# Patient Record
Sex: Female | Born: 1970 | Race: Black or African American | Hispanic: No | Marital: Married | Smoking: Never smoker
Health system: Southern US, Community
[De-identification: ages and names within clinical notes are randomized; demographics above are authoritative.]

## PROBLEM LIST (undated history)

## (undated) DIAGNOSIS — J45909 Unspecified asthma, uncomplicated: Secondary | ICD-10-CM

## (undated) DIAGNOSIS — I10 Essential (primary) hypertension: Secondary | ICD-10-CM

## (undated) DIAGNOSIS — D509 Iron deficiency anemia, unspecified: Principal | ICD-10-CM

## (undated) DIAGNOSIS — N920 Excessive and frequent menstruation with regular cycle: Secondary | ICD-10-CM

## (undated) HISTORY — DX: Excessive and frequent menstruation with regular cycle: N92.0

## (undated) HISTORY — PX: BACK SURGERY: SHX140

## (undated) HISTORY — DX: Iron deficiency anemia, unspecified: D50.9

---

## 1997-08-17 ENCOUNTER — Encounter: Admission: RE | Admit: 1997-08-17 | Discharge: 1997-08-17 | Payer: Self-pay | Admitting: *Deleted

## 1998-03-27 ENCOUNTER — Other Ambulatory Visit: Admission: RE | Admit: 1998-03-27 | Discharge: 1998-03-27 | Payer: Self-pay | Admitting: Obstetrics

## 1999-06-02 ENCOUNTER — Other Ambulatory Visit: Admission: RE | Admit: 1999-06-02 | Discharge: 1999-06-02 | Payer: Self-pay | Admitting: Obstetrics

## 2001-07-17 ENCOUNTER — Inpatient Hospital Stay (HOSPITAL_COMMUNITY): Admission: AD | Admit: 2001-07-17 | Discharge: 2001-07-17 | Payer: Self-pay | Admitting: Obstetrics

## 2001-09-06 ENCOUNTER — Encounter (INDEPENDENT_AMBULATORY_CARE_PROVIDER_SITE_OTHER): Payer: Self-pay | Admitting: Specialist

## 2001-09-06 ENCOUNTER — Encounter: Payer: Self-pay | Admitting: Obstetrics

## 2001-09-06 ENCOUNTER — Inpatient Hospital Stay (HOSPITAL_COMMUNITY): Admission: AD | Admit: 2001-09-06 | Discharge: 2001-09-09 | Payer: Self-pay | Admitting: Obstetrics

## 2002-03-21 ENCOUNTER — Encounter: Payer: Self-pay | Admitting: Neurosurgery

## 2002-03-27 ENCOUNTER — Encounter: Payer: Self-pay | Admitting: Neurosurgery

## 2002-03-27 ENCOUNTER — Ambulatory Visit (HOSPITAL_COMMUNITY): Admission: RE | Admit: 2002-03-27 | Discharge: 2002-03-27 | Payer: Self-pay | Admitting: Neurosurgery

## 2006-03-16 ENCOUNTER — Inpatient Hospital Stay (HOSPITAL_COMMUNITY): Admission: RE | Admit: 2006-03-16 | Discharge: 2006-03-19 | Payer: Self-pay | Admitting: Obstetrics

## 2006-03-16 ENCOUNTER — Encounter (INDEPENDENT_AMBULATORY_CARE_PROVIDER_SITE_OTHER): Payer: Self-pay | Admitting: Specialist

## 2009-02-07 ENCOUNTER — Ambulatory Visit (HOSPITAL_COMMUNITY): Admission: RE | Admit: 2009-02-07 | Discharge: 2009-02-07 | Payer: Self-pay | Admitting: Obstetrics and Gynecology

## 2010-02-02 ENCOUNTER — Encounter: Payer: Self-pay | Admitting: Obstetrics

## 2010-02-07 ENCOUNTER — Encounter
Admission: RE | Admit: 2010-02-07 | Discharge: 2010-02-07 | Payer: Self-pay | Source: Home / Self Care | Attending: Family Medicine | Admitting: Family Medicine

## 2010-05-30 NOTE — Op Note (Signed)
NAMEDAYTONA, HEDMAN              ACCOUNT NO.:  [id number]   MEDICAL RECORD NO.:  [id number]          PATIENT TYPE:  INP   LOCATION:  9125                          FACILITY:  [facility]   PHYSICIAN:  Kathreen Cosier, M.D.DATE OF BIRTH:  [birth year]   DATE OF PROCEDURE:  03/16/2006  DATE OF DISCHARGE:                               OPERATIVE REPORT   PREOPERATIVE DIAGNOSIS:  Previous cesarean section at term, desires  repeat and tubal ligation.   SURGEON:  Kathreen Cosier, M.D.   FIRST ASSISTANT:  Charles A. Clearance Coots, M.D.   ANESTHESIA:  Spinal anesthesia.   DESCRIPTION OF PROCEDURE:  The patient was placed on the operating room  table in the supine position.  The abdomen was prepped and draped,  bladder emptied with Foley catheter.  A transverse suprapubic incision  was made through old scar, carried down through the rectus fascia.  The  fascia cleaned and incised to the left of the incision.  Rectus muscles  retracted laterally.  The peritoneum was incised longitudinally.  A  transverse incision was made in the viscera peritoneum of the bladder.  Bladder mobilized superiorly.  A transverse lower uterine incision was  made.  Fluid clear.  Patient delivered from the LOA position of a  female, Apgar 8 and 9 weighing 7 pounds 2 ounces.  Pediatrician in  attendance.  Placenta was found and removed manually and sent to labor  and delivery.  The uterine cavity cleaned with dry laps.  The uterine  incision closed in one layer with continuous suture of #1 chromic.  Hemostasis satisfactory.  Bladder flap re-attached with 2-0 chromic.  Uterus well contracted, tubes and ovaries normal.  The right tube was  grasped in the mid portion with a Babcock clamp, 0 plain suture placed  in the mesosalpinx, with noted portion of tube within the clamp.  This  was tied and approximately one inch of tube transected.  Hemostasis  satisfactory.  The procedure was done in a similar fashion on other  side.   Lap and sponge counts correct.  Abdomen closed in layers,  peritoneum continuous suture 0 chromic, fascia continuous suture of 0  Dexon and the skin closed with subcuticular stitch of 4-0 Monocryl.  Blood loss 500 mL.           ______________________________  Kathreen Cosier, M.D.     BAM/MEDQ  D:  03/16/2006  T:  03/16/2006  Job:  [id number]

## 2010-05-30 NOTE — H&P (Signed)
   NAME:  Caitlin Hernandez, Caitlin Hernandez                        ACCOUNT NO.:  [id number]   MEDICAL RECORD NO.:  [id number]                   PATIENT TYPE:  INP   LOCATION:  9145                                 FACILITY:  [facility]   PHYSICIAN:  Kathreen Cosier, M.D.           DATE OF BIRTH:  Sep 10, 1970   DATE OF ADMISSION:  09/06/2001  DATE OF DISCHARGE:                                HISTORY & PHYSICAL   HISTORY:  The patient is a 40 year old gravida 1, estimated date of  conception  09-03-01 who was to be admitted for induction on the morning of  09-06-01.  However, she came in early labor. Cervix 1 cm, central placenta,  vertex -2 to -1 station.  She had a positive group B streptococcus which was  treated at 36 weeks while hospitalized.  Her membranes were artificially  ruptured. The fluid was clear.  Throughout the course at the hospital in  labor the patient never received more than 3 milliunits of Pitocin.  By  12:45 p.m. the patient was 4 to 5 cm, 100%, vertex -2 with molding.  She was  contracting every two minutes.  The patient had a 4 minute deceleration. I  was called at 2:53 p.m. by the nurse and told that the patient had delivered  without me.  I left the office, came to the hospital where the patient was  being pushed down the hall to the operating room at 3:02 p.m. because of  prolonged fetal bradycardia.  She had an epidural in place and surgery was  begun at 3:05 p.m.   PHYSICAL EXAMINATION:  Physical examination revealed a well-developed female  in labor.  HEENT:  Negative.  LUNGS:  Clear.  HEART:  Regular rate. No murmurs. No gallops.  ABDOMEN: Term sized uterus. Estimated fetal weight 7 pounds 8 ounces. At the  time of physical on  admission her cervix was 1 cm dilated.  EXTREMITIES:  Legs were negative.                                               Kathreen Cosier, M.D.    BAM/MEDQ  D:  09/06/2001  T:  09/07/2001  Job:  [id number]

## 2010-05-30 NOTE — Op Note (Signed)
NAME:  Caitlin Hernandez, Caitlin Hernandez                        ACCOUNT NO.:  [id number]   MEDICAL RECORD NO.:  [id number]                   PATIENT TYPE:  OIB   LOCATION:  3172                                 FACILITY:  [facility]   PHYSICIAN:  Donalee Citrin, M.D.                     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   PROCEDURE:  Lumbar laminectomy and microdiskectomy, L5-S1 right, with  microscopic dissection of the right S1 nerve root and microscopic  diskectomy.   SURGEON:  Donalee Citrin, M.D.   ASSISTANT:  Marland Mcalpine. Elesa Hacker, M.D.   ANESTHESIA:  General endotracheal.   CLINICAL NOTE:  The patient is a very pleasant 40 year old female who has  had long-standing back and right leg pain radiating down to the outside of  the foot and the sole of the foot with numbness and tingling in the same  distribution.  The patient has been refractory to conservative treatment.  Preoperative imaging showed a very large ruptured disk at L5-S1 compressing  the right S1 nerve root.  I extensively recommended decompression, lumbar  laminectomy and microdiskectomy.  I went over the risks and benefits of  surgery with her.  She understands and agreed to proceed forward.   DESCRIPTION OF PROCEDURE:  The patient was brought to the OR, was induced  under general anesthesia, positioned prone on the Wilson frame.  The back  was prepped and draped in the usual sterile fashion.  After infiltration of  10 mL of lidocaine with epinephrine, a midline incision was made.  Bovie  electrocautery was used to take down through the subcutaneous tissue and  subperiosteal dissection was carried out in the lamina of L5 and S1 on the  right side.  Then using a 3 and 4 mm Kerrison punch, the inferior aspect of  the lamina of L5 and the medial aspect of the  facet complex as well as the  superior aspect of the lamina of S1.  Then ligamentum flavum was removed.  The ligamentum flavum was removed in piecemeal fashion after localization  film confirmed the L5-S1 disk space.  Then the thecal sac was then  visualized.  The operating microscope was draped, brought into the field.  Under microscopic illumination the remainder of the S1 nerve root was  identified.  It was also noted to be overlying an S2 nerve root.  The S1  nerve root was explored with a 4 Penfield and noted to be very adherent to a  large, bulbous disk fragment inferior to it.  Then the epidural veins were  coagulated.  Using a 4 Penfield, the S1 nerve root was dissected free of the  large, bulbous disk fragment and reflected medially with  a D'Errico nerve  root retractor.  Then using an 11 blade scalpel, an annulotomy was made.  Several large fragments of disk were removed from the disk space.  Then the  disk space was radically cleaned out with pituitary rongeurs and downgoing  Epstein curettes.  At the end of the diskectomy the S1 nerve root was freely  mobile over the entirety of the disk space.  The axilla was explored as well  as the thecal sac cephalad and caudal.  There was no further stenosis or  ruptured disk appreciated.  The neural foramen was explored with a coronary  dilator and an angled hockey stick and noted to have no further stenosis.  Then the wound was copiously irrigated and meticulous hemostasis was  maintained.  Gelfoam was overlaid on top of the dura.  Muscle and fascia  were reapproximated with 0 interrupted Vicryl, the subcutaneous  tissue was closed with 2-0 interrupted Vicryl, and the skin was closed with  running 4-0 subcuticular.  Benzoin and Steri-Strips were applied.  The  patient went to the recovery room in stable condition.  At the end of the  case all needle counts and sponge counts were correct.                                                Donalee Citrin, M.D.    GC/MEDQ  D:  03/27/2002  T:  03/27/2002  Job:  [id number]

## 2010-05-30 NOTE — Discharge Summary (Signed)
NAMEWYONIA, FONTANELLA              ACCOUNT NO.:  [id number]   MEDICAL RECORD NO.:  [id number]          PATIENT TYPE:  INP   LOCATION:  9125                          FACILITY:  [facility]   PHYSICIAN:  Kathreen Cosier, M.D.DATE OF BIRTH:  07-03-1970   DATE OF ADMISSION:  03/16/2006  DATE OF DISCHARGE:  03/19/2006                               DISCHARGE SUMMARY   The patient is a 40 year old gravida 2, para 1-0-0-1, EDC March 22, 2006.  She had a previous C-section, desired repeat C-section and tubal  ligation at term.  She underwent repeat low transverse cesarean section,  had a female Apgar 8 and 9, weighing 7 pounds and 2 ounces, and a tubal  ligation.  On admission, her hemoglobin was 11, postoperative 8.9.  White count 5 to 6.6.  PT/PTT normal.  Sodium 133, potassium 3.4,  chloride 105, urine negative.  She was discharged on the third  postoperative day ambulatory on a regular diet on Tylenol #3 for pain  and ferrous sulfate for her anemia.   DISCHARGE DIAGNOSIS:  Status post repeat low transverse cesarean section  and tubal ligation at term.           ______________________________  Kathreen Cosier, M.D.     BAM/MEDQ  D:  04/14/2006  T:  04/14/2006  Job:  [id number]

## 2010-05-30 NOTE — Op Note (Signed)
Caitlin Hernandez, Caitlin Hernandez              ACCOUNT NO.:  [id number]   MEDICAL RECORD NO.:  [id number]          PATIENT TYPE:  INP   LOCATION:  9125                          [facility]   PHYSICIAN:  Kathreen Cosier, M.D.DATE OF BIRTH:  17-Apr-1970   DATE OF PROCEDURE:  03/16/2006  DATE OF DISCHARGE:                               OPERATIVE REPORT   PREOPERATIVE DIAGNOSIS:  DICTATION ENDED AT THIS POINT.           ______________________________  Kathreen Cosier, M.D.     BAM/MEDQ  D:  03/16/2006  T:  03/16/2006  Job:  [id number]

## 2010-05-30 NOTE — Discharge Summary (Signed)
   NAME:  Caitlin Hernandez, Caitlin Hernandez                        ACCOUNT NO.:  [id number]   MEDICAL RECORD NO.:  [id number]                   PATIENT TYPE:  INP   LOCATION:  9145                                 [facility]   PHYSICIAN:  Kathreen Cosier, M.D.           DATE OF BIRTH:  [birth year]   DATE OF ADMISSION:  09/06/2001  DATE OF DISCHARGE:  09/09/2001                                 DISCHARGE SUMMARY   HISTORY OF PRESENT ILLNESS:  The patient is a 40 year old primigravida, [facility] [place]  September 03, 2001 who was brought in on August 26 for induction but she was  already in labor.  Cervix 1 cm, 70%, vertex, -1/-2.  She had a positive GBS  which was treated at 35 weeks.  Amniotomy was performed and the fluid was  clear.  The patient was in labor and received a maximum of 3 milliunits of  Pitocin throughout her labor.  She dilated to 8 cm and suddenly had fetal  bradycardia lasting four minutes which recovered and then another episode  which did not.  She was taken for an emergency cesarean section and had a  female Apgars 3 and 8 from the LOA position.  Cord pH 6.96.  Baby weighed 7  pounds.  Placenta was sent to pathology.  Postoperatively the patient did  well.  She was discharged on the third postoperative day and returned to  regular diet.  On Tylox for pain.  Hemoglobin 12.2.   DISCHARGE DIAGNOSES:  Status post primary low transverse cesarean section at  term for nonreassuring fetal heart rate tracing.                                               Kathreen Cosier, M.D.    BAM/MEDQ  D:  09/09/2001  T:  09/09/2001  Job:  [id number]

## 2010-05-30 NOTE — Op Note (Signed)
   NAME:  Caitlin Hernandez, Caitlin Hernandez                        ACCOUNT NO.:  [id number]   MEDICAL RECORD NO.:  [id number]                   PATIENT TYPE:  INP   LOCATION:  9145                                 [facility]   PHYSICIAN:  Kathreen Cosier, M.D.           DATE OF BIRTH:  04-10-1970   DATE OF PROCEDURE:  09/06/2001  DATE OF DISCHARGE:                                 OPERATIVE REPORT   PREOPERATIVE DIAGNOSIS:  Prolonged fetal bradycardia.   SURGEON:  Kathreen Cosier, M.D.   FIRST ASSISTANT:  Naima A. Normand Sloop, M.D.   PROCEDURE IN DETAIL:  The patient placed on the operating table in the  supine position.  The abdomen prepped and draped.  The bladder emptied with  a Foley catheter.  Transverse suprapubic incision made at 3:05 p.m., carried  down to the rectus fascia.  The fascia cleaned and incised the length of the  incision.  Rectus muscle retracted lateral.  The peritoneum incised  longitudinally.  The subperitoneum above the bladder was incised and bladder  mobilized inferiorly.  Transverse low uterine incision made.  The fluid was  clear.  The patient delivered at 3:07 p.m. of a female, Apgars 3/8.  Team  was in attendance.  The placenta was anterior and removed manually and sent  to pathology.  The uterus cleaned with dry laps.  The cord pH was 6.96.  The  uterine cavity was cleaned with dry laps, the uterine incision closed in one  layer with continuous suture of 1 chromic including myometrium and  endometrium.  The myometrium was imbricated in 1 chromic suture.  The  bladder flap reattached with 2-0 chromic.  The uterus was well contracted.  Tubes were normal.  Ovaries normal.  The abdomen closed in layers.  The  peritoneum continuous suture of 0 chromic fashion, continuous suture of 0  Dexon.  The skin closed with subcuticular suture of 3-0 plain.  Blood loss  500 cc.  The patient tolerated the procedure well, taken to the recovery  room in good condition.                                                Kathreen Cosier, M.D.    BAM/MEDQ  D:  09/06/2001  T:  09/06/2001  Job:  [id number]

## 2012-05-25 ENCOUNTER — Emergency Department (HOSPITAL_COMMUNITY)
Admission: EM | Admit: 2012-05-25 | Discharge: 2012-05-25 | Disposition: A | Discharge status: Home / Self Care | Payer: BC Managed Care – PPO | Attending: Emergency Medicine | Admitting: Emergency Medicine

## 2012-05-25 ENCOUNTER — Encounter (HOSPITAL_COMMUNITY): Payer: Self-pay | Admitting: Emergency Medicine

## 2012-05-25 ENCOUNTER — Emergency Department (HOSPITAL_COMMUNITY): Payer: BC Managed Care – PPO

## 2012-05-25 DIAGNOSIS — I10 Essential (primary) hypertension: Secondary | ICD-10-CM | POA: Insufficient documentation

## 2012-05-25 DIAGNOSIS — J45901 Unspecified asthma with (acute) exacerbation: Secondary | ICD-10-CM | POA: Insufficient documentation

## 2012-05-25 DIAGNOSIS — Z79899 Other long term (current) drug therapy: Secondary | ICD-10-CM | POA: Insufficient documentation

## 2012-05-25 DIAGNOSIS — J40 Bronchitis, not specified as acute or chronic: Secondary | ICD-10-CM

## 2012-05-25 DIAGNOSIS — R0789 Other chest pain: Secondary | ICD-10-CM | POA: Insufficient documentation

## 2012-05-25 HISTORY — DX: Unspecified asthma, uncomplicated: J45.909

## 2012-05-25 HISTORY — DX: Essential (primary) hypertension: I10

## 2012-05-25 LAB — CBC WITH DIFFERENTIAL/PLATELET
Eosinophils Relative: 16 % — ABNORMAL HIGH (ref 0–5)
HCT: 33.2 % — ABNORMAL LOW (ref 36.0–46.0)
Hemoglobin: 11.3 g/dL — ABNORMAL LOW (ref 12.0–15.0)
Lymphocytes Relative: 41 % (ref 12–46)
Lymphs Abs: 2.2 10*3/uL (ref 0.7–4.0)
MCV: 84.5 fL (ref 78.0–100.0)
Monocytes Absolute: 0.3 10*3/uL (ref 0.1–1.0)
Monocytes Relative: 6 % (ref 3–12)
RBC: 3.93 MIL/uL (ref 3.87–5.11)
WBC: 5.3 10*3/uL (ref 4.0–10.5)

## 2012-05-25 LAB — COMPREHENSIVE METABOLIC PANEL
ALT: 16 U/L (ref 0–35)
CO2: 26 mEq/L (ref 19–32)
Calcium: 9.1 mg/dL (ref 8.4–10.5)
Creatinine, Ser: 0.63 mg/dL (ref 0.50–1.10)
GFR calc Af Amer: >90 mL/min (ref >90)
GFR calc non Af Amer: >90 mL/min (ref >90)
Glucose, Bld: 116 mg/dL — ABNORMAL HIGH (ref 70–99)
Sodium: 139 mEq/L (ref 135–145)

## 2012-05-25 MED ORDER — PREDNISONE 50 MG PO TABS: ORAL_TABLET | ORAL | Status: DC

## 2012-05-25 MED ORDER — ALBUTEROL SULFATE (5 MG/ML) 0.5% IN NEBU
INHALATION_SOLUTION | RESPIRATORY_TRACT | Status: AC
  Filled 2012-05-25: qty 1

## 2012-05-25 MED ORDER — ALBUTEROL SULFATE (5 MG/ML) 0.5% IN NEBU
5.0000 mg | INHALATION_SOLUTION | Freq: Once | RESPIRATORY_TRACT | Status: AC
  Administered 2012-05-25: 5 mg via RESPIRATORY_TRACT
  Filled 2012-05-25: qty 1

## 2012-05-25 MED ORDER — IPRATROPIUM BROMIDE 0.02 % IN SOLN
0.5000 mg | Freq: Once | RESPIRATORY_TRACT | Status: AC
  Administered 2012-05-25: 0.5 mg via RESPIRATORY_TRACT
  Filled 2012-05-25: qty 2.5

## 2012-05-25 MED ORDER — IPRATROPIUM BROMIDE 0.02 % IN SOLN
RESPIRATORY_TRACT | Status: AC
  Filled 2012-05-25: qty 2.5

## 2012-05-25 MED ORDER — DOXYCYCLINE HYCLATE 100 MG PO CAPS: 100.0000 mg | ORAL_CAPSULE | Freq: Two times a day (BID) | ORAL | Status: DC

## 2012-05-25 MED ORDER — ALBUTEROL SULFATE HFA 108 (90 BASE) MCG/ACT IN AERS: 2.0000 | INHALATION_SPRAY | RESPIRATORY_TRACT | Status: DC | PRN

## 2012-05-25 MED ORDER — PREDNISONE 20 MG PO TABS
60.0000 mg | ORAL_TABLET | Freq: Once | ORAL | Status: AC
  Administered 2012-05-25: 60 mg via ORAL
  Filled 2012-05-25: qty 3

## 2012-05-25 NOTE — ED Notes (Signed)
Pulse ox is 96-98% ra heat rate is  98-110

## 2012-05-25 NOTE — ED Provider Notes (Signed)
History     CSN: [id number]  Arrival date & time 05/25/12  0746   First MD Initiated Contact with Patient 05/25/12 0800      Chief Complaint  Patient presents with  . Cough    (Consider location/radiation/quality/duration/timing/severity/associated sxs/prior treatment) HPI Comments: Patient presents with one week history of difficulty breathing and feels like she is "breathing through a straw". It is associated with dry cough, worse in the morning and worse when she goes outside. Has some chest tightness with coughing. Denies any fevers or chills. Today she saw a speck of blood when she was coughing. Denies any leg pain or swelling. Denies any recent travel. Denies being diagnosed with asthma but has used inhalers in the past for seasonal breathing difficulties. Denies any smoking history.  The history is provided by the patient.    Past Medical History  Diagnosis Date  . Asthma   . Hypertension     No past surgical history on file.  No family history on file.  History  Substance Use Topics  . Smoking status: Never Smoker   . Smokeless tobacco: Not on file  . Alcohol Use: Yes    OB History   Grav Para Term Preterm Abortions TAB SAB Ect Mult Living                  Review of Systems  Constitutional: Positive for activity change and appetite change. Negative for fever.  HENT: Negative for congestion and rhinorrhea.   Respiratory: Positive for cough, chest tightness and wheezing.   Cardiovascular: Negative for chest pain.  Gastrointestinal: Negative for nausea, vomiting and abdominal pain.  Genitourinary: Negative for dysuria, hematuria, vaginal bleeding and vaginal discharge.  Musculoskeletal: Negative for back pain.  Skin: Negative for wound.  Neurological: Negative for dizziness, weakness and headaches.  A complete 10 system review of systems was obtained and all systems are negative except as noted in the HPI and PMH.    Allergies  Review of patient's  allergies indicates no known allergies.  Home Medications   Current Outpatient Rx  Name  Route  Sig  Dispense  Refill  . GuaiFENesin (MUCINEX PO)   Oral   Take 1 tablet by mouth 2 (two) times daily as needed (for congestion).          Marland Kitchen loratadine (CLARITIN) 10 MG tablet   Oral   Take 10 mg by mouth daily.         . Olmesartan-Amlodipine-HCTZ (TRIBENZOR) 20-5-12.5 MG TABS   Oral   Take 1 tablet by mouth daily.         Marland Kitchen albuterol (PROVENTIL HFA;VENTOLIN HFA) 108 (90 BASE) MCG/ACT inhaler   Inhalation   Inhale 2 puffs into the lungs every 4 (four) hours as needed for wheezing.   1 Inhaler   0   . doxycycline (VIBRAMYCIN) 100 MG capsule   Oral   Take 1 capsule (100 mg total) by mouth 2 (two) times daily.   20 capsule   0   . predniSONE (DELTASONE) 50 MG tablet      1 tablet PO daily   5 tablet   0     BP 120/79  Pulse 77  Temp(Src) 98.8 F (37.1 C) (Oral)  Resp 18  SpO2 97%  LMP 05/12/2012  Physical Exam  Constitutional: She is oriented to person, place, and time. She appears well-developed and well-nourished. No distress.  HENT:  Head: Normocephalic and atraumatic.  Mouth/Throat: Oropharynx is clear and moist. No  oropharyngeal exudate.  Eyes: Conjunctivae and EOM are normal. Pupils are equal, round, and reactive to light.  Neck: Normal range of motion. Neck supple.  Cardiovascular: Normal rate, regular rhythm and normal heart sounds.   No murmur heard. Pulmonary/Chest: Effort normal and breath sounds normal. No respiratory distress.  Good [place] bilaterally, expiratory wheezing  Abdominal: Soft. There is no tenderness. There is no rebound and no guarding.  Musculoskeletal: Normal range of motion. She exhibits no edema and no tenderness.  No peripheral edema  Neurological: She is alert and oriented to person, place, and time. No cranial nerve deficit. She exhibits normal muscle tone. Coordination normal.  Skin: Skin is warm.    ED Course   Procedures (including critical care time)  Labs Reviewed  CBC WITH DIFFERENTIAL - Abnormal; Notable for the following:    Hemoglobin 11.3 (*)    HCT 33.2 (*)    Neutrophils Relative % 36 (*)    Eosinophils Relative 16 (*)    Eosinophils Absolute 0.9 (*)    All other components within normal limits  COMPREHENSIVE METABOLIC PANEL - Abnormal; Notable for the following:    Potassium 3.3 (*)    Glucose, Bld 116 (*)    Albumin 3.4 (*)    All other components within normal limits  TROPONIN I  D-DIMER, QUANTITATIVE   Dg Chest 2 View  05/25/2012   *RADIOLOGY REPORT*  Clinical Data: Shortness of breath  CHEST - 2 VIEW  Comparison: None.  Findings: Lungs are clear.  No pleural effusion or pneumothorax.  Cardiomediastinal silhouette is within normal limits.  Visualized osseous structures are within normal limits.  IMPRESSION: No evidence of acute cardiopulmonary disease.   Original Report Authenticated By: Charline Bills, M.D.     1. Bronchitis       MDM  Cough x 1 week with sputum production, streak of blood today.  Some chest tightness with coughing. No fever.  Scarce wheezing on exam.  CXR clear.  No evidence of CHF. D-dimer negative given hemoptysis.   Will treat for bronchitis.  Nebs, steroids, antibiotics. ambulatory without desaturation.        Glynn Octave, MD 05/25/12 [phone]

## 2012-05-25 NOTE — ED Notes (Signed)
Pt states cough for 1 weeks states that it always gets bad this time of year has small amt sputum today had blood in it  states makes her chest hurt  When she coughs no prolonged travel. States feel sob when she walks short distance has hx of allergies no fever sob and cough  worse in am and  At night states feels better sleeping on 2 pillows

## 2013-01-05 ENCOUNTER — Emergency Department (HOSPITAL_COMMUNITY)
Admission: EM | Admit: 2013-01-05 | Discharge: 2013-01-05 | Disposition: A | Discharge status: Home / Self Care | Payer: BC Managed Care – PPO | Attending: Emergency Medicine | Admitting: Emergency Medicine

## 2013-01-05 ENCOUNTER — Emergency Department (HOSPITAL_COMMUNITY): Payer: BC Managed Care – PPO

## 2013-01-05 ENCOUNTER — Encounter (HOSPITAL_COMMUNITY): Payer: Self-pay | Admitting: Emergency Medicine

## 2013-01-05 DIAGNOSIS — R05 Cough: Secondary | ICD-10-CM | POA: Insufficient documentation

## 2013-01-05 DIAGNOSIS — R6889 Other general symptoms and signs: Secondary | ICD-10-CM

## 2013-01-05 DIAGNOSIS — Z79899 Other long term (current) drug therapy: Secondary | ICD-10-CM | POA: Insufficient documentation

## 2013-01-05 DIAGNOSIS — R059 Cough, unspecified: Secondary | ICD-10-CM | POA: Insufficient documentation

## 2013-01-05 DIAGNOSIS — R0789 Other chest pain: Secondary | ICD-10-CM | POA: Insufficient documentation

## 2013-01-05 DIAGNOSIS — IMO0001 Reserved for inherently not codable concepts without codable children: Secondary | ICD-10-CM | POA: Insufficient documentation

## 2013-01-05 DIAGNOSIS — J45909 Unspecified asthma, uncomplicated: Secondary | ICD-10-CM | POA: Insufficient documentation

## 2013-01-05 DIAGNOSIS — J029 Acute pharyngitis, unspecified: Secondary | ICD-10-CM | POA: Insufficient documentation

## 2013-01-05 DIAGNOSIS — I10 Essential (primary) hypertension: Secondary | ICD-10-CM | POA: Insufficient documentation

## 2013-01-05 DIAGNOSIS — J09X2 Influenza due to identified novel influenza A virus with other respiratory manifestations: Secondary | ICD-10-CM | POA: Insufficient documentation

## 2013-01-05 DIAGNOSIS — J3489 Other specified disorders of nose and nasal sinuses: Secondary | ICD-10-CM | POA: Insufficient documentation

## 2013-01-05 MED ORDER — ALBUTEROL SULFATE HFA 108 (90 BASE) MCG/ACT IN AERS
2.0000 | INHALATION_SPRAY | Freq: Once | RESPIRATORY_TRACT | Status: AC
  Administered 2013-01-05: 2 via RESPIRATORY_TRACT
  Filled 2013-01-05: qty 6.7

## 2013-01-05 MED ORDER — HYDROCODONE-HOMATROPINE 5-1.5 MG/5ML PO SYRP
5.0000 mL | ORAL_SOLUTION | Freq: Once | ORAL | Status: AC
  Administered 2013-01-05: 5 mL via ORAL
  Filled 2013-01-05: qty 5

## 2013-01-05 MED ORDER — ACETAMINOPHEN 325 MG PO TABS
650.0000 mg | ORAL_TABLET | Freq: Once | ORAL | Status: AC
  Administered 2013-01-05: 650 mg via ORAL
  Filled 2013-01-05: qty 2

## 2013-01-05 MED ORDER — HYDROCODONE-HOMATROPINE 5-1.5 MG/5ML PO SYRP: 5.0000 mL | ORAL_SOLUTION | Freq: Four times a day (QID) | ORAL | Status: DC | PRN

## 2013-01-05 NOTE — ED Provider Notes (Signed)
Medical screening examination/treatment/procedure(s) were performed by non-physician practitioner and as supervising physician I was immediately available for consultation/collaboration.  EKG Interpretation   None         William Shalane Florendo, MD 01/05/13 1454 

## 2013-01-05 NOTE — ED Provider Notes (Signed)
CSN: [id number]     Arrival date & time 01/05/13  1247 History  This chart was scribed for non-physician practitioner Francee Piccolo, PA-C working with Dagmar Hait, MD by Danella Maiers, ED Scribe. This patient was seen in room WTR7/WTR7 and the patient's care was started at 1:08 PM.   Chief Complaint  Patient presents with  . Cough  . Fever  . Generalized Body Aches  . Sore Throat   The history is provided by the patient. No language interpreter was used.   HPI Comments: Caitlin Hernandez is a 42 y.o. female with a h/o asthma and bronchitis who presents to the Emergency Department complaining of fever, productive cough with yellow sputum, body aches, and sore throat onset 2 days ago. She report tightness and soreness in the chest especially after coughing. She also reports rhinorrhea and congestion. She has been taking Sudafed and Mucinex with some relief. She has been using her inhaler twice per day with relief. She states the cough worsens at night. She denies sick contacts. Patient has not taken any fever reducer or pain medication today.    Past Medical History  Diagnosis Date  . Asthma   . Hypertension    Past Surgical History  Procedure Laterality Date  . Back surgery    . Cesarean section     No family history on file. History  Substance Use Topics  . Smoking status: Never Smoker   . Smokeless tobacco: Not on file  . Alcohol Use: Yes     Comment: occ   OB History   Grav Para Term Preterm Abortions TAB SAB Ect Mult Living                 Review of Systems  Constitutional: Positive for fever.  HENT: Positive for congestion, rhinorrhea and sore throat.   Respiratory: Positive for cough and chest tightness.   Musculoskeletal: Positive for myalgias.  All other systems reviewed and are negative.    Allergies  Review of patient's allergies indicates no known allergies.  Home Medications   Current Outpatient Rx  Name  Route  Sig  Dispense  Refill  .  albuterol (PROVENTIL HFA;VENTOLIN HFA) 108 (90 BASE) MCG/ACT inhaler   Inhalation   Inhale 2 puffs into the lungs every 4 (four) hours as needed for wheezing.   1 Inhaler   0   . guaiFENesin (MUCINEX) 600 MG 12 hr tablet   Oral   Take 1,200 mg by mouth 2 (two) times daily.         . montelukast (SINGULAIR) 10 MG tablet   Oral   Take 10 mg by mouth at bedtime.         . pseudoephedrine (SUDAFED) 120 MG 12 hr tablet   Oral   Take 120 mg by mouth 2 (two) times daily.         Marland Kitchen HYDROcodone-homatropine (HYCODAN) 5-1.5 MG/5ML syrup   Oral   Take 5 mLs by mouth every 6 (six) hours as needed for cough.   120 mL   0   . Olmesartan-Amlodipine-HCTZ (TRIBENZOR) 20-5-12.5 MG TABS   Oral   Take 1 tablet by mouth daily.          BP 148/83  Pulse 109  Temp(Src) 102.6 F (39.2 C) (Oral)  Resp 16  SpO2 97%  LMP 11/18/2012 Physical Exam  Constitutional: She is oriented to person, place, and time. She appears well-developed and well-nourished. No distress.  HENT:  Head: Normocephalic and atraumatic.  Right Ear: Tympanic membrane, external ear and ear canal normal.  Left Ear: Tympanic membrane, external ear and ear canal normal.  Nose: Rhinorrhea present.  Mouth/Throat: Uvula is midline and mucous membranes are normal. No trismus in the jaw. No uvula swelling. Posterior oropharyngeal erythema present. No oropharyngeal exudate, posterior oropharyngeal edema or tonsillar abscesses.  Eyes: Conjunctivae are normal.  Neck: Normal range of motion. Neck supple.  Cardiovascular: Normal rate, regular rhythm and normal heart sounds.  Exam reveals no gallop and no friction rub.   No murmur heard. Pulmonary/Chest: Effort normal and breath sounds normal. She has no wheezes. She has no rales.  Abdominal: Soft.  Musculoskeletal: Normal range of motion.  Neurological: She is alert and oriented to person, place, and time.  Skin: Skin is warm and dry. She is not diaphoretic.  Psychiatric: She  has a normal mood and affect.    ED Course  Procedures (including critical care time) Medications  acetaminophen (TYLENOL) tablet 650 mg (650 mg Oral Given 01/05/13 1308)  HYDROcodone-homatropine (HYCODAN) 5-1.5 MG/5ML syrup 5 mL (5 mLs Oral Given 01/05/13 1350)  albuterol (PROVENTIL HFA;VENTOLIN HFA) 108 (90 BASE) MCG/ACT inhaler 2 puff (2 puffs Inhalation Given 01/05/13 1350)   DIAGNOSTIC STUDIES: Oxygen Saturation is 97% on RA, normal by my interpretation.    COORDINATION OF CARE: 1:25 PM- Discussed treatment plan with pt which includes CXR and codeine cough syrup. Pt agrees to plan.    Labs Review Labs Reviewed  RESPIRATORY VIRUS PANEL   Imaging Review Dg Chest 2 View  01/05/2013   CLINICAL DATA:  Productive cough and chest pain  EXAM: CHEST  2 VIEW  COMPARISON:  May 25, 2012  FINDINGS: Lungs are clear. Heart size and pulmonary vascularity are normal. No adenopathy. No pneumothorax. No bone lesions. There are rudimentary cervical ribs bilaterally.  IMPRESSION: No edema or consolidation.   Electronically Signed   By: Bretta Bang M.D.   On: 01/05/2013 13:27    EKG Interpretation   None      Filed Vitals:   01/05/13 1354  BP:   Pulse: 99  Temp: 100.9 F (38.3 C)  Resp: 18     MDM   1. Flu-like symptoms     Patient with symptoms consistent with influenza.  Vitals are stable, low-grade fever.  No signs of dehydration, tolerating PO's.  Lungs are clear. Due to patient's presentation and physical exam a chest x-ray was not ordered bc likely diagnosis of flu.  Discussed the cost versus benefit of Tamiflu treatment with the patient.  The patient understands that symptoms are greater than the recommended 24-48 hour window of treatment.  Patient will be discharged with instructions to orally hydrate, rest, and use over-the-counter medications such as anti-inflammatories ibuprofen and Aleve for muscle aches and Tylenol for fever.  Patient will also be given a cough  suppressant.  Return precautions discussed. Patient is agreeable to plan. Patient is stable at time of discharge   I personally performed the services described in this documentation, which was scribed in my presence. The recorded information has been reviewed and is accurate.     Jeannetta Ellis, PA-C 01/05/13 1417

## 2013-01-05 NOTE — ED Notes (Signed)
Pt from home reports that she has cough, fever, body aches, sore throat x2 days. Pt denies N/V/D. Pt is A&O and in NAD

## 2013-01-06 LAB — RESPIRATORY VIRUS PANEL
Adenovirus: NOT DETECTED
Influenza A H3: DETECTED — AB
Influenza A: DETECTED — AB
Metapneumovirus: NOT DETECTED
Parainfluenza 1: NOT DETECTED
Parainfluenza 2: NOT DETECTED
Respiratory Syncytial Virus A: NOT DETECTED
Respiratory Syncytial Virus B: NOT DETECTED
Rhinovirus: NOT DETECTED

## 2013-06-07 ENCOUNTER — Other Ambulatory Visit (HOSPITAL_COMMUNITY): Payer: Self-pay | Admitting: Obstetrics

## 2013-06-07 DIAGNOSIS — Z1231 Encounter for screening mammogram for malignant neoplasm of breast: Secondary | ICD-10-CM

## 2013-06-07 LAB — CYTOLOGY - PAP: PAP SMEAR: NEGATIVE

## 2013-06-27 ENCOUNTER — Ambulatory Visit (HOSPITAL_COMMUNITY)
Admission: RE | Admit: 2013-06-27 | Discharge: 2013-06-27 | Disposition: A | Discharge status: Home / Self Care | Payer: BC Managed Care – PPO | Source: Ambulatory Visit | Attending: Obstetrics | Admitting: Obstetrics

## 2013-06-27 DIAGNOSIS — Z1231 Encounter for screening mammogram for malignant neoplasm of breast: Secondary | ICD-10-CM | POA: Insufficient documentation

## 2013-06-28 ENCOUNTER — Other Ambulatory Visit: Payer: Self-pay | Admitting: Obstetrics

## 2013-06-28 ENCOUNTER — Telehealth: Payer: Self-pay | Admitting: Hematology and Oncology

## 2013-06-28 DIAGNOSIS — R928 Other abnormal and inconclusive findings on diagnostic imaging of breast: Secondary | ICD-10-CM

## 2013-06-28 NOTE — Telephone Encounter (Signed)
S/W PATIENT AND GAVE NP APPT FOR 07/06 @ 2:15 W/DR. GORSUCH.  REFERRING DR. Adrian SaranVEITA BLAND DX-LOW IRON LEVEL  WELCOME PACKET MAILED.

## 2013-07-13 ENCOUNTER — Ambulatory Visit
Admission: RE | Admit: 2013-07-13 | Discharge: 2013-07-13 | Disposition: A | Discharge status: Home / Self Care | Payer: BC Managed Care – PPO | Source: Ambulatory Visit | Attending: Obstetrics | Admitting: Obstetrics

## 2013-07-13 DIAGNOSIS — R928 Other abnormal and inconclusive findings on diagnostic imaging of breast: Secondary | ICD-10-CM

## 2013-07-17 ENCOUNTER — Encounter: Payer: Self-pay | Admitting: Hematology and Oncology

## 2013-07-17 ENCOUNTER — Telehealth: Payer: Self-pay | Admitting: Hematology and Oncology

## 2013-07-17 ENCOUNTER — Ambulatory Visit: Payer: BC Managed Care – PPO

## 2013-07-17 ENCOUNTER — Ambulatory Visit (HOSPITAL_BASED_OUTPATIENT_CLINIC_OR_DEPARTMENT_OTHER): Payer: BC Managed Care – PPO | Admitting: Hematology and Oncology

## 2013-07-17 VITALS — BP 137/88 | HR 83 | Temp 98.4°F | Resp 19 | Ht 61.0 in | Wt 176.4 lb

## 2013-07-17 DIAGNOSIS — N920 Excessive and frequent menstruation with regular cycle: Secondary | ICD-10-CM

## 2013-07-17 DIAGNOSIS — D72819 Decreased white blood cell count, unspecified: Secondary | ICD-10-CM | POA: Insufficient documentation

## 2013-07-17 DIAGNOSIS — D509 Iron deficiency anemia, unspecified: Secondary | ICD-10-CM

## 2013-07-17 HISTORY — DX: Excessive and frequent menstruation with regular cycle: N92.0

## 2013-07-17 HISTORY — DX: Iron deficiency anemia, unspecified: D50.9

## 2013-07-17 NOTE — Progress Notes (Signed)
Checked in new patient with no financial issues prior to see the dr. She has not been out of the country and has appt card. She wants copay billed.

## 2013-07-17 NOTE — Telephone Encounter (Signed)
S/w the pt and she is aware of her oct appts °

## 2013-07-17 NOTE — Assessment & Plan Note (Signed)
This is likely due to chronic menorrhagia. I recommend she try oral iron supplement first. I give her direction to start oral iron supplement once a day and to increase to twice a day if tolerated. I plan to see her back in 3 months with repeat blood work the week prior. If she is not able to tolerate oral iron supplement or if her repeat blood work in 3 months show no improvement, I will proceed with intravenous iron infusion and she agreed.

## 2013-07-17 NOTE — Progress Notes (Signed)
[facility] CONSULT NOTE  Patient Care Team: Renaye RakersVeita Bland, MD as PCP - General (Family Medicine)  CHIEF COMPLAINTS/PURPOSE OF CONSULTATION:  Iron deficiency anemia  HISTORY OF PRESENTING ILLNESS:  Caitlin Hernandez [age] y.o. female is here because of anemia.  She was found to have abnormal CBC from routine blood work. CBC from May of 2014 show mild anemia with hemoglobin of 11.3 and with normal white blood cell count. Repeat blood work in June 2015 showed a white count of 3.8 with hemoglobin of 10.5. She denies recent chest pain on exertion, shortness of breath on minimal exertion, pre-syncopal episodes, or palpitations. She complained of fatigue. She had not noticed any recent bleeding such as epistaxis, hematuria or hematochezia. She has chronic menorrhagia with very heavy periods within the first 2 days to the point she has to change her sanitary pads every hour. The patient denies over the counter NSAID ingestion. She is not on antiplatelets agents.  She had no prior history or diagnosis of cancer. Her age appropriate screening programs are up-to-date. She has pica with excessive chewing of ice and eats a variety of diet. She never donated blood or received blood transfusion  MEDICAL HISTORY:  Past Medical History  Diagnosis Date  . Asthma   . Hypertension   . Iron deficiency anemia 07/17/2013  . Menorrhagia 07/17/2013    SURGICAL HISTORY: Past Surgical History  Procedure Laterality Date  . Back surgery    . Cesarean section      SOCIAL HISTORY: History   Social History  . Marital Status: Married    Spouse Name: N/A    Number of Children: N/A  . Years of Education: N/A   Occupational History  . Not on file.   Social History Main Topics  . Smoking status: Never Smoker   . Smokeless tobacco: Never Used  . Alcohol Use: Yes     Comment: occ  . Drug Use: Not on file  . Sexual Activity: Not on file   Other Topics Concern  . Not on file   Social History  Narrative  . No narrative on file    FAMILY HISTORY: History reviewed. No pertinent family history.  ALLERGIES:  has No Known Allergies.  MEDICATIONS:  Current Outpatient Prescriptions  Medication Sig Dispense Refill  . CONTRAVE 8-90 MG TB12 Take 2 tablets by mouth 2 (two) times daily.      . montelukast (SINGULAIR) 10 MG tablet Take 10 mg by mouth at bedtime.      . Olmesartan-Amlodipine-HCTZ (TRIBENZOR) 20-5-12.5 MG TABS Take 1 tablet by mouth daily.       No current facility-administered medications for this visit.    REVIEW OF SYSTEMS:   Constitutional: Denies fevers, chills or abnormal night sweats Eyes: Denies blurriness of vision, double vision or watery eyes Ears, nose, mouth, throat, and face: Denies mucositis or sore throat Respiratory: Denies cough, dyspnea or wheezes Cardiovascular: Denies palpitation, chest discomfort or lower extremity swelling Gastrointestinal:  Denies nausea, heartburn or change in bowel habits Skin: Denies abnormal skin rashes Lymphatics: Denies new lymphadenopathy or easy bruising Neurological:Denies numbness, tingling or new weaknesses Behavioral/Psych: Mood is stable, no new changes  All other systems were reviewed with the patient and are negative.  PHYSICAL EXAMINATION: ECOG PERFORMANCE STATUS: 0 - Asymptomatic  Filed Vitals:   07/17/13 1424  BP: 137/88  Pulse: 83  Temp: 98.4 F (36.9 C)  Resp: 19   Filed Weights   07/17/13 1424  Weight: 176 lb 6.4 oz (  80.015 kg)    GENERAL:alert, no distress and comfortable SKIN: skin color, texture, turgor are normal, no rashes or significant lesions EYES: normal, conjunctiva are pink and non-injected, sclera clear OROPHARYNX:no exudate, no erythema and lips, buccal mucosa, and tongue normal  NECK: supple, thyroid normal size, non-tender, without nodularity LYMPH:  no palpable lymphadenopathy in the cervical, axillary or inguinal LUNGS: clear to auscultation and percussion with normal  breathing effort HEART: regular rate & rhythm and no murmurs and no lower extremity edema ABDOMEN:abdomen soft, non-tender and normal bowel sounds Musculoskeletal:no cyanosis of digits and no clubbing  PSYCH: alert & oriented x 3 with fluent speech NEURO: no focal motor/sensory deficits  LABORATORY DATA:  I have reviewed the data as listed  ASSESSMENT & PLAN:  Iron deficiency anemia This is likely due to chronic menorrhagia. I recommend she try oral iron supplement first. I give her direction to start oral iron supplement once a day and to increase to twice a day if tolerated. I plan to see her back in 3 months with repeat blood work the week prior. If she is not able to tolerate oral iron supplement or if her repeat blood work in 3 months show no improvement, I will proceed with intravenous iron infusion and she agreed.  Menorrhagia I discussed with her management of this. This is due to uterine fibroids.  The patient declined birth control pills.   Leukopenia This is not unusual for African American patients. I recommend observation for now as she is not symptomatic. Her white blood cell count in 2014 was normal.      All questions were answered. The patient knows to call the clinic with any problems, questions or concerns. I spent 30 minutes counseling the patient face to face. The total time spent in the appointment was 40 minutes and more than 50% was on counseling.     [facility] HospitalGORSUCH, Ignacia Gentzler, MD 07/17/2013 2:53 PM

## 2013-07-17 NOTE — Addendum Note (Signed)
Addended byBertis Ruddy: Averey Koning on: 07/17/2013 02:57 PM   Modules accepted: Level of Service

## 2013-07-17 NOTE — Assessment & Plan Note (Signed)
I discussed with her management of this. This is due to uterine fibroids.  The patient declined birth control pills.

## 2013-07-17 NOTE — Assessment & Plan Note (Signed)
This is not unusual for African American patients. I recommend observation for now as she is not symptomatic. Her white blood cell count in 2014 was normal.

## 2013-10-17 ENCOUNTER — Other Ambulatory Visit (HOSPITAL_BASED_OUTPATIENT_CLINIC_OR_DEPARTMENT_OTHER): Payer: BC Managed Care – PPO

## 2013-10-17 DIAGNOSIS — N920 Excessive and frequent menstruation with regular cycle: Secondary | ICD-10-CM

## 2013-10-17 DIAGNOSIS — D509 Iron deficiency anemia, unspecified: Secondary | ICD-10-CM

## 2013-10-17 LAB — CBC & DIFF AND RETIC
BASO%: 0.6 % (ref 0.0–2.0)
Basophils Absolute: 0 10*3/uL (ref 0.0–0.1)
EOS%: 5.6 % (ref 0.0–7.0)
Eosinophils Absolute: 0.3 10*3/uL (ref 0.0–0.5)
HCT: 36.4 % (ref 34.8–46.6)
HEMOGLOBIN: 12.6 g/dL (ref 11.6–15.9)
IMMATURE RETIC FRACT: 6.2 % (ref 1.60–10.00)
LYMPH#: 2.1 10*3/uL (ref 0.9–3.3)
LYMPH%: 44.3 % (ref 14.0–49.7)
MCH: 29.2 pg (ref 25.1–34.0)
MCHC: 34.6 g/dL (ref 31.5–36.0)
MCV: 84.3 fL (ref 79.5–101.0)
MONO#: 0.4 10*3/uL (ref 0.1–0.9)
MONO%: 8.4 % (ref 0.0–14.0)
NEUT#: 1.9 10*3/uL (ref 1.5–6.5)
NEUT%: 41.1 % (ref 38.4–76.8)
Platelets: 180 10*3/uL (ref 145–400)
RBC: 4.32 10*6/uL (ref 3.70–5.45)
RDW: 13.4 % (ref 11.2–14.5)
RETIC %: 1.39 % (ref 0.70–2.10)
RETIC CT ABS: 60.05 10*3/uL (ref 33.70–90.70)
WBC: 4.7 10*3/uL (ref 3.9–10.3)

## 2013-10-18 LAB — FERRITIN CHCC: Ferritin: 24 ng/ml (ref 9–269)

## 2013-10-18 LAB — IRON AND TIBC CHCC
%SAT: 23 % (ref 21–57)
Iron: 86 ug/dL (ref 41–142)
TIBC: 380 ug/dL (ref 236–444)
UIBC: 294 ug/dL (ref 120–384)

## 2013-10-19 ENCOUNTER — Telehealth: Payer: Self-pay | Admitting: *Deleted

## 2013-10-19 ENCOUNTER — Telehealth: Payer: Self-pay | Admitting: Medical Oncology

## 2013-10-19 NOTE — Telephone Encounter (Signed)
Informed pt of lab results.  Dr. Bertis RuddyGorsuch instructs pt to continue oral iron, no need for iv iron and recheck labs in 3 months.  Pt verbalized understanding ok w/ rechecking labs in 3 months.

## 2013-10-19 NOTE — Telephone Encounter (Signed)
Message copied by Rexene EdisonLEONETTI, Elward Nocera C on Thu Oct 19, 2013 10:11 AM ------      Message from: Estill BakesWINDHAM, PHONACELLE C      Created: Thu Oct 19, 2013  9:35 AM      Regarding: FW: lab result                   ----- Message -----         From: Artis DelayNi Gorsuch, MD         Sent: 10/19/2013   8:39 AM           To: Rolanda JayPhonacelle C Windham, RN      Subject: lab result                                               Please let her know CBC is better, iron studies are still a bit low. Recommend she continues oral iron supplement, no need IV iron.      If OK with her, we can recheck labs in 3 months and reschedule next week appt until 3 months from now. Please let me know      ----- Message -----         From: [facility]         Sent: 10/17/2013   2:58 PM           To: Artis DelayNi Gorsuch, MD                   ------

## 2013-10-24 ENCOUNTER — Ambulatory Visit: Payer: BC Managed Care – PPO | Admitting: Hematology and Oncology

## 2014-01-15 ENCOUNTER — Other Ambulatory Visit: Payer: Self-pay | Admitting: Hematology and Oncology

## 2014-01-15 DIAGNOSIS — D509 Iron deficiency anemia, unspecified: Secondary | ICD-10-CM

## 2014-01-16 ENCOUNTER — Other Ambulatory Visit: Payer: BC Managed Care – PPO

## 2014-09-18 ENCOUNTER — Other Ambulatory Visit (HOSPITAL_COMMUNITY): Payer: Self-pay | Admitting: Family Medicine

## 2014-09-18 DIAGNOSIS — Z1231 Encounter for screening mammogram for malignant neoplasm of breast: Secondary | ICD-10-CM

## 2014-09-28 ENCOUNTER — Ambulatory Visit (HOSPITAL_COMMUNITY)
Admission: RE | Admit: 2014-09-28 | Discharge: 2014-09-28 | Disposition: A | Discharge status: Home / Self Care | Payer: BC Managed Care – PPO | Source: Ambulatory Visit | Attending: Family Medicine | Admitting: Family Medicine

## 2014-09-28 DIAGNOSIS — Z1231 Encounter for screening mammogram for malignant neoplasm of breast: Secondary | ICD-10-CM | POA: Diagnosis not present

## 2015-10-04 ENCOUNTER — Encounter: Payer: Self-pay | Admitting: *Deleted

## 2016-02-03 ENCOUNTER — Other Ambulatory Visit: Payer: Self-pay | Admitting: Family Medicine

## 2016-02-03 DIAGNOSIS — Z1231 Encounter for screening mammogram for malignant neoplasm of breast: Secondary | ICD-10-CM

## 2016-02-17 ENCOUNTER — Ambulatory Visit
Admission: RE | Admit: 2016-02-17 | Discharge: 2016-02-17 | Disposition: A | Discharge status: Home / Self Care | Payer: BC Managed Care – PPO | Source: Ambulatory Visit | Attending: Family Medicine | Admitting: Family Medicine

## 2016-02-17 DIAGNOSIS — Z1231 Encounter for screening mammogram for malignant neoplasm of breast: Secondary | ICD-10-CM

## 2017-02-22 ENCOUNTER — Other Ambulatory Visit: Payer: Self-pay | Admitting: Family Medicine

## 2017-02-22 DIAGNOSIS — Z1231 Encounter for screening mammogram for malignant neoplasm of breast: Secondary | ICD-10-CM

## 2017-03-10 ENCOUNTER — Other Ambulatory Visit: Payer: Self-pay | Admitting: Obstetrics and Gynecology

## 2017-03-10 DIAGNOSIS — R5381 Other malaise: Secondary | ICD-10-CM

## 2017-04-16 ENCOUNTER — Other Ambulatory Visit: Payer: Self-pay | Admitting: Obstetrics and Gynecology

## 2017-04-16 DIAGNOSIS — N644 Mastodynia: Secondary | ICD-10-CM

## 2017-05-04 ENCOUNTER — Ambulatory Visit
Admission: RE | Admit: 2017-05-04 | Discharge: 2017-05-04 | Disposition: A | Discharge status: Home / Self Care | Payer: BC Managed Care – PPO | Source: Ambulatory Visit | Attending: Obstetrics and Gynecology | Admitting: Obstetrics and Gynecology

## 2017-05-04 ENCOUNTER — Ambulatory Visit: Payer: BC Managed Care – PPO

## 2017-05-04 DIAGNOSIS — N644 Mastodynia: Secondary | ICD-10-CM

## 2019-05-16 ENCOUNTER — Emergency Department (HOSPITAL_COMMUNITY): Payer: BC Managed Care – PPO

## 2019-05-16 ENCOUNTER — Encounter (HOSPITAL_COMMUNITY): Payer: Self-pay | Admitting: Emergency Medicine

## 2019-05-16 ENCOUNTER — Emergency Department (HOSPITAL_COMMUNITY)
Admission: EM | Admit: 2019-05-16 | Discharge: 2019-05-16 | Disposition: A | Discharge status: Home / Self Care | Payer: BC Managed Care – PPO | Attending: Emergency Medicine | Admitting: Emergency Medicine

## 2019-05-16 ENCOUNTER — Other Ambulatory Visit: Payer: Self-pay

## 2019-05-16 DIAGNOSIS — Z79899 Other long term (current) drug therapy: Secondary | ICD-10-CM | POA: Diagnosis not present

## 2019-05-16 DIAGNOSIS — J189 Pneumonia, unspecified organism: Secondary | ICD-10-CM | POA: Diagnosis not present

## 2019-05-16 DIAGNOSIS — R509 Fever, unspecified: Secondary | ICD-10-CM | POA: Diagnosis present

## 2019-05-16 DIAGNOSIS — I10 Essential (primary) hypertension: Secondary | ICD-10-CM | POA: Diagnosis not present

## 2019-05-16 DIAGNOSIS — J45909 Unspecified asthma, uncomplicated: Secondary | ICD-10-CM | POA: Insufficient documentation

## 2019-05-16 DIAGNOSIS — Z20822 Contact with and (suspected) exposure to covid-19: Secondary | ICD-10-CM | POA: Insufficient documentation

## 2019-05-16 LAB — CBC WITH DIFFERENTIAL/PLATELET
Abs Immature Granulocytes: 0.02 10*3/uL (ref 0.00–0.07)
Basophils Absolute: 0 10*3/uL (ref 0.0–0.1)
Basophils Relative: 0 %
Eosinophils Absolute: 0.1 10*3/uL (ref 0.0–0.5)
Eosinophils Relative: 1 %
HCT: 32.2 % — ABNORMAL LOW (ref 36.0–46.0)
Hemoglobin: 10.7 g/dL — ABNORMAL LOW (ref 12.0–15.0)
Immature Granulocytes: 0 %
Lymphocytes Relative: 19 %
Lymphs Abs: 1.2 10*3/uL (ref 0.7–4.0)
MCH: 27.9 pg (ref 26.0–34.0)
MCHC: 33.2 g/dL (ref 30.0–36.0)
MCV: 83.9 fL (ref 80.0–100.0)
Monocytes Absolute: 0.7 10*3/uL (ref 0.1–1.0)
Monocytes Relative: 11 %
Neutro Abs: 4.6 10*3/uL (ref 1.7–7.7)
Neutrophils Relative %: 69 %
Platelets: 150 10*3/uL (ref 150–400)
RBC: 3.84 MIL/uL — ABNORMAL LOW (ref 3.87–5.11)
RDW: 13.7 % (ref 11.5–15.5)
WBC: 6.7 10*3/uL (ref 4.0–10.5)
nRBC: 0 % (ref 0.0–0.2)

## 2019-05-16 LAB — COMPREHENSIVE METABOLIC PANEL
ALT: 25 U/L (ref 0–44)
AST: 23 U/L (ref 15–41)
Albumin: 3.6 g/dL (ref 3.5–5.0)
Alkaline Phosphatase: 66 U/L (ref 38–126)
Anion gap: 9 (ref 5–15)
BUN: 12 mg/dL (ref 6–20)
CO2: 25 mmol/L (ref 22–32)
Calcium: 8.8 mg/dL — ABNORMAL LOW (ref 8.9–10.3)
Chloride: 103 mmol/L (ref 98–111)
Creatinine, Ser: 0.73 mg/dL (ref 0.44–1.00)
GFR calc Af Amer: >60 mL/min (ref >60)
GFR calc non Af Amer: >60 mL/min (ref >60)
Glucose, Bld: 96 mg/dL (ref 70–99)
Potassium: 3 mmol/L — ABNORMAL LOW (ref 3.5–5.1)
Sodium: 137 mmol/L (ref 135–145)
Total Bilirubin: 1 mg/dL (ref 0.3–1.2)
Total Protein: 7.8 g/dL (ref 6.5–8.1)

## 2019-05-16 LAB — D-DIMER, QUANTITATIVE (NOT AT ARMC): D-Dimer, Quant: 0.67 ug/mL-FEU — ABNORMAL HIGH (ref 0.00–0.50)

## 2019-05-16 LAB — POC SARS CORONAVIRUS 2 AG -  ED: SARS Coronavirus 2 Ag: NEGATIVE

## 2019-05-16 MED ORDER — IOHEXOL 350 MG/ML SOLN
100.0000 mL | Freq: Once | INTRAVENOUS | Status: AC | PRN
  Administered 2019-05-16: 100 mL via INTRAVENOUS

## 2019-05-16 MED ORDER — FLUTICASONE PROPIONATE 50 MCG/ACT NA SUSP
2.0000 | Freq: Every day | NASAL | 0 refills | Status: DC
Start: 2019-05-16 — End: 2022-11-09

## 2019-05-16 MED ORDER — AZITHROMYCIN 250 MG PO TABS
250.0000 mg | ORAL_TABLET | Freq: Every day | ORAL | 0 refills | Status: DC
Start: 2019-05-16 — End: 2022-11-09

## 2019-05-16 MED ORDER — ONDANSETRON HCL 4 MG/2ML IJ SOLN
4.0000 mg | Freq: Once | INTRAMUSCULAR | Status: AC
  Administered 2019-05-16: 18:00:00 4 mg via INTRAVENOUS
  Filled 2019-05-16: qty 2

## 2019-05-16 MED ORDER — POTASSIUM CHLORIDE CRYS ER 20 MEQ PO TBCR
40.0000 meq | EXTENDED_RELEASE_TABLET | Freq: Once | ORAL | Status: AC
  Administered 2019-05-16: 40 meq via ORAL
  Filled 2019-05-16: qty 2

## 2019-05-16 MED ORDER — ONDANSETRON 4 MG PO TBDP: 4.0000 mg | ORAL_TABLET | Freq: Three times a day (TID) | ORAL | 0 refills | Status: DC | PRN

## 2019-05-16 MED ORDER — CEFTRIAXONE SODIUM 1 G IJ SOLR
1.0000 g | Freq: Once | INTRAMUSCULAR | Status: AC
  Administered 2019-05-16: 20:00:00 1 g via INTRAVENOUS
  Filled 2019-05-16: qty 10

## 2019-05-16 MED ORDER — BENZONATATE 100 MG PO CAPS
100.0000 mg | ORAL_CAPSULE | Freq: Three times a day (TID) | ORAL | 0 refills | Status: DC | PRN
Start: 2019-05-16 — End: 2022-11-09

## 2019-05-16 MED ORDER — ACETAMINOPHEN 325 MG PO TABS
650.0000 mg | ORAL_TABLET | Freq: Once | ORAL | Status: AC
  Administered 2019-05-16: 18:00:00 650 mg via ORAL
  Filled 2019-05-16: qty 2

## 2019-05-16 MED ORDER — AMOXICILLIN 500 MG PO CAPS: 1000.0000 mg | ORAL_CAPSULE | Freq: Three times a day (TID) | ORAL | 0 refills | Status: AC

## 2019-05-16 MED ORDER — PHENOL 1.4 % MT LIQD
1.0000 | OROMUCOSAL | 0 refills | Status: DC | PRN
Start: 2019-05-16 — End: 2022-11-09

## 2019-05-16 MED ORDER — SODIUM CHLORIDE 0.9 % IV SOLN
500.0000 mg | Freq: Once | INTRAVENOUS | Status: AC
  Administered 2019-05-16: 500 mg via INTRAVENOUS
  Filled 2019-05-16: qty 500

## 2019-05-16 NOTE — ED Triage Notes (Signed)
Arrives via EMS from home. C/C fever and SOB. Reports fever that started Friday, went to UC and got tested for COVID and it came back negative. Endorses SOB since Sunday. Has been taking motrin.

## 2019-05-16 NOTE — ED Provider Notes (Signed)
[facility]-EMERGENCY DEPT Provider Note   CSN: [id number] Arrival date & time: 05/16/19  1637     History Chief Complaint  Patient presents with  . Fever  . Shortness of Breath    Caitlin Hernandez is a 49 y.o. female with history of asthma, hypertension, anemia presents for evaluation of acute onset, persistent and progressively worsening fevers, myalgias for 5 days.  Reports fevers up to 104 F at home that improved somewhat with Motrin.  She has felt dyspnea on exertion and talking but no shortness of breath at rest.  Today she awoke with sharp right-sided chest pains that worsen with deep inspiration.  She has had some nausea but no vomiting.  Today she woke with frontal headache, sinus pressure, sore throat.  She is a non-smoker.  She recently traveled to [facility] 2 weeks ago, reports this was a 3.5 hour drive and that they took breaks.   The history is provided by the patient.       Past Medical History:  Diagnosis Date  . Asthma   . Hypertension   . Iron deficiency anemia 07/17/2013  . Menorrhagia 07/17/2013    Patient Active Problem List   Diagnosis Date Noted  . Iron deficiency anemia 07/17/2013  . Menorrhagia 07/17/2013  . Leukopenia 07/17/2013    Past Surgical History:  Procedure Laterality Date  . BACK SURGERY    . CESAREAN SECTION       OB History   No obstetric history on file.     No family history on file.  Social History   Tobacco Use  . Smoking status: Never Smoker  . Smokeless tobacco: Never Used  Substance Use Topics  . Alcohol use: Yes    Comment: occ  . Drug use: Not on file    Home Medications Prior to Admission medications   Medication Sig Start Date End Date Taking? Authorizing Provider  gabapentin (NEURONTIN) 300 MG capsule Take 300 mg by mouth at bedtime.   Yes [provider]  ibuprofen (ADVIL) 200 MG tablet Take 200 mg by mouth every 6 (six) hours as needed for headache or moderate pain.   Yes  [provider]  Olmesartan-Amlodipine-HCTZ (TRIBENZOR) 20-5-12.5 MG TABS Take 1 tablet by mouth at bedtime.    Yes [provider]  potassium chloride SA (KLOR-CON) 20 MEQ tablet Take 20 mEq by mouth daily. 04/23/19  Yes [provider]  amoxicillin (AMOXIL) 500 MG capsule Take 2 capsules (1,000 mg total) by mouth 3 (three) times daily for 5 days. 05/16/19 05/21/19  Michela Pitcher A, PA-C  azithromycin (ZITHROMAX) 250 MG tablet Take 1 tablet (250 mg total) by mouth daily. Take first 2 tablets together, then 1 every day until finished. 05/16/19   Carleigh Buccieri A, PA-C  benzonatate (TESSALON) 100 MG capsule Take 1 capsule (100 mg total) by mouth 3 (three) times daily as needed for cough. 05/16/19   Tinya Cadogan A, PA-C  fluticasone (FLONASE) 50 MCG/ACT nasal spray Place 2 sprays into both nostrils daily. 05/16/19   Aitanna Haubner A, PA-C  ondansetron (ZOFRAN ODT) 4 MG disintegrating tablet Take 1 tablet (4 mg total) by mouth every 8 (eight) hours as needed for nausea or vomiting. 05/16/19   Dj Senteno A, PA-C  phenol (CHLORASEPTIC) 1.4 % LIQD Use as directed 1 spray in the mouth or throat as needed for throat irritation / pain. 05/16/19   Michela Pitcher A, PA-C    Allergies    Patient has no  known allergies.  Review of Systems   Review of Systems  Respiratory: Positive for cough, chest tightness and shortness of breath.   Cardiovascular: Positive for chest pain. Negative for leg swelling.  Gastrointestinal: Positive for nausea. Negative for abdominal pain, diarrhea and vomiting.    Physical Exam Updated Vital Signs BP (!) 108/58   Pulse 90   Temp 97.8 F (36.6 C) (Oral)   Resp 14   Ht 5' 1.5" (1.562 m)   Wt 86.2 kg   SpO2 100%   BMI 35.32 kg/m   Physical Exam Vitals and nursing note reviewed.  Constitutional:      General: She is not in acute distress.    Appearance: She is well-developed.  HENT:     Head: Normocephalic and atraumatic.     Mouth/Throat:     Mouth: Mucous  membranes are moist.     Pharynx: No oropharyngeal exudate.     Comments: Uvula is midline.  Tolerating secretions without difficulty.  Mild posterior pharyngeal erythema but no tonsillar exudates Eyes:     General:        Right eye: No discharge.        Left eye: No discharge.     Conjunctiva/sclera: Conjunctivae normal.  Neck:     Vascular: No JVD.     Trachea: No tracheal deviation.  Cardiovascular:     Rate and Rhythm: Regular rhythm. Tachycardia present.     Pulses: Normal pulses.     Comments: Mildly tachycardic at rest, heart rate up to 120 upon standing, 2+ radial and DP/PT pulses bilaterally, Homans sign absent bilaterally, no lower extremity edema, no palpable cords, compartments are soft  Pulmonary:     Effort: Pulmonary effort is normal.     Comments: SPO2 saturations 96 to 100% on room air at rest and with ambulation.  Diminished breath sounds along the right lung base. Chest:     Chest wall: No tenderness.  Abdominal:     General: Bowel sounds are normal. There is no distension.     Palpations: Abdomen is soft.     Tenderness: There is no abdominal tenderness. There is no guarding or rebound.  Musculoskeletal:     Cervical back: Neck supple.     Right lower leg: No tenderness. No edema.     Left lower leg: No tenderness. No edema.  Skin:    General: Skin is warm and dry.     Findings: No erythema.  Neurological:     Mental Status: She is alert.  Psychiatric:        Behavior: Behavior normal.     ED Results / Procedures / Treatments   Labs (all labs ordered are listed, but only abnormal results are displayed) Labs Reviewed  D-DIMER, QUANTITATIVE (NOT AT [facility]) - Abnormal; Notable for the following components:      Result Value   D-Dimer, Quant 0.67 (*)    All other components within normal limits  CBC WITH DIFFERENTIAL/PLATELET - Abnormal; Notable for the following components:   RBC 3.84 (*)    Hemoglobin 10.7 (*)    HCT 32.2 (*)    All other components  within normal limits  COMPREHENSIVE METABOLIC PANEL - Abnormal; Notable for the following components:   Potassium 3.0 (*)    Calcium 8.8 (*)    All other components within normal limits  SARS CORONAVIRUS 2 (TAT 6-24 HRS)  POC SARS CORONAVIRUS 2 AG -  ED    EKG EKG Interpretation  Date/Time:  Tuesday May 16 2019 16:49:10 EDT Ventricular Rate:  105 PR Interval:    QRS Duration: 80 QT Interval:  293 QTC Calculation: 388 R Axis:   24 Text Interpretation: Sinus tachycardia Probable left atrial enlargement Borderline T wave abnormalities No STEMI Confirmed by Alona Bene [phone]) on 05/16/2019 6:02:21 PM Also confirmed by Alona Bene [phone]), editor Sheppard Evens ([id number])  on 05/17/2019 8:56:17 AM   Radiology CT Angio Chest PE W and/or Wo Contrast  Result Date: 05/16/2019 CLINICAL DATA:  Chest pain and shortness of breath. Fever. EXAM: CT ANGIOGRAPHY CHEST WITH CONTRAST TECHNIQUE: Multidetector CT imaging of the chest was performed using the standard protocol during bolus administration of intravenous contrast. Multiplanar CT image reconstructions and MIPs were obtained to evaluate the vascular anatomy. CONTRAST:  OMNIPAQUE IOHEXOL 350 MG/ML SOLN COMPARISON:  Chest x-ray 05/16/2019 FINDINGS: Cardiovascular: The heart is upper limits of normal in size. No pericardial effusion. The aorta is normal in caliber. No atherosclerotic calcifications. No dissection. The branch vessels are patent. Possible scattered coronary artery calcifications. The pulmonary arterial tree is fairly well opacified. No filling defects to suggest pulmonary embolism. Mild enlargement of the pulmonary arterial trunk could suggest pulmonary hypertension. Mediastinum/Nodes: Subcarinal and right infrahilar lymph nodes likely inflammatory/reactive due to the right lower lobe process. No mass or overt adenopathy. The esophagus is grossly normal. The thyroid gland contains a 4 mm nodule. Lungs/Pleura: Fairly extensive airspace  disease in the right lower lobe most consistent with pneumonia. No worrisome pulmonary lesions. No pleural effusions. Upper Abdomen: No significant upper abdominal findings. Musculoskeletal: No breast masses, supraclavicular or axillary adenopathy. The bony thorax is intact. Review of the MIP images confirms the above findings. IMPRESSION: 1. No CT findings for pulmonary embolism. 2. Normal thoracic aorta. 3. Right lower lobe pneumonia and reactive right hilar and subcarinal lymph nodes. 4. Enlargement of the pulmonary arterial trunk may suggest pulmonary hypertension. Electronically Signed   By: Rudie Meyer M.D.   On: 05/16/2019 19:31   DG Chest Portable 1 View  Result Date: 05/16/2019 CLINICAL DATA:  Shortness of breath, fever EXAM: PORTABLE CHEST 1 VIEW COMPARISON:  01/05/2013 FINDINGS: The heart size and mediastinal contours are within normal limits. Suspect heterogeneous airspace opacity of the paramedian right middle lobe or right lower lobe. The visualized skeletal structures are unremarkable. IMPRESSION: Suspect heterogeneous airspace opacity of the paramedian right middle lobe or right lower lobe, concerning for infection. Mass is not strictly excluded. PA and lateral radiographs may be helpful to further assess. Electronically Signed   By: Lauralyn Primes M.D.   On: 05/16/2019 17:36    Procedures Procedures (including critical care time)  Medications Ordered in ED Medications  acetaminophen (TYLENOL) tablet 650 mg (650 mg Oral Given 05/16/19 1755)  ondansetron (ZOFRAN) injection 4 mg (4 mg Intravenous Given 05/16/19 1755)  iohexol (OMNIPAQUE) 350 MG/ML injection 100 mL (100 mLs Intravenous Contrast Given 05/16/19 1913)  cefTRIAXone (ROCEPHIN) 1 g in sodium chloride 0.9 % 100 mL IVPB (0 g Intravenous Stopped 05/16/19 2030)  azithromycin (ZITHROMAX) 500 mg in sodium chloride 0.9 % 250 mL IVPB (0 mg Intravenous Stopped 05/16/19 2132)  potassium chloride SA (KLOR-CON) CR tablet 40 mEq (40 mEq Oral Given  05/16/19 1953)    ED Course  I have reviewed the triage vital signs and the nursing notes.  Pertinent labs & imaging results that were available during my care of the patient were reviewed by me and considered in my medical decision making (see chart for details).  MDM Rules/Calculators/A&P                      Caitlin Hernandez was evaluated in Emergency Department on 05/17/2019 for the symptoms described in the history of present illness. She was evaluated in the context of the global COVID-19 pandemic, which necessitated consideration that the patient might be at risk for infection with the SARS-CoV-2 virus that causes COVID-19. Institutional protocols and algorithms that pertain to the evaluation of patients at risk for COVID-19 are in a state of rapid change based on information released by regulatory bodies including the CDC and federal and state organizations. These policies and algorithms were followed during the patient's care in the ED.  Patient presents for evaluation of fever, cough, shortness of breath, myalgias and upper respiratory symptoms.  She is mildly febrile in the ED, intermittently tachycardic and tachypneic but vital signs are otherwise stable.  She was ambulated in the ED with stable SPO2 saturations.  Lab work reviewed and interpreted by myself shows no leukocytosis, mild anemia, mild hypokalemia, no renal insufficiency.  Her potassium was replenished in the ED.  Her EKG shows sinus tachycardia, no acute ischemic abnormalities.  Her chest pain is reproducible on palpation I doubt ACS/MI.  D-dimer was obtained which was elevated so we proceeded with a CTA of the chest to rule out PE.  Imaging shows right lower lobe pneumonia and reactive right hilar and subcarinal lymph nodes with no evidence of PE, dissection, pneumonia, pericardial effusion, or esophageal rupture.  Physical examination is not concerning for strep pharyngitis, PTA, retropharyngeal abscess or meningitis.  On  reevaluation patient is resting comfortably in no apparent distress.  She reports that she feels better after antipyretics, nausea medicine.  She was given IV Rocephin and a azithromycin in the ED.  I suspect this is more likely bacterial as she has had 2 negative point-of-care Covid tests but we will obtain more accurate send out testing as well.  Given she is maintaining her airway and oxygen saturations and tolerating p.o. without difficulty I think it is reasonable to manage her condition outpatient.  Discussed symptomatic management as well.  She will follow-up with her PCP for reevaluation of her symptoms.  Discussed strict ED return precautions. Patient verbalized understanding of and agreement with plan and is safe for discharge home at this time.     Final Clinical Impression(s) / ED Diagnoses Final diagnoses:  Community acquired pneumonia of right lower lobe of lung    Rx / DC Orders ED Discharge Orders         Ordered    ondansetron (ZOFRAN ODT) 4 MG disintegrating tablet  Every 8 hours PRN     05/16/19 2018    phenol (CHLORASEPTIC) 1.4 % LIQD  As needed     05/16/19 2018    amoxicillin (AMOXIL) 500 MG capsule  3 times daily     05/16/19 2018    azithromycin (ZITHROMAX) 250 MG tablet  Daily     05/16/19 2018    benzonatate (TESSALON) 100 MG capsule  3 times daily PRN     05/16/19 2018    fluticasone (FLONASE) 50 MCG/ACT nasal spray  Daily     05/16/19 2018           Jeanie Sewer, PA-C 05/17/19 1843    Maia Plan, MD 05/17/19 2139

## 2019-05-16 NOTE — Discharge Instructions (Signed)
Your work-up today showed evidence of pneumonia in the right lower lobe of the lung.  No evidence of blood clots in the lung.  Your rapid Covid test here was negative so we will follow up with a more accurate 1.  You will receive a phone call if the test is positive, no phone call if the test results are negative.  You can also view your results online on MyChart.  Please take all of your antibiotics until finished!   Take your antibiotics with food.  Common side effects of antibiotics include nausea, vomiting, abdominal discomfort, and diarrhea. You may help offset some of this with probiotics which you can buy or get in yogurt. Do not eat  or take the probiotics until 2 hours after your antibiotic.    Some studies suggest that certain antibiotics can reduce the efficacy of certain oral contraceptive pills (birth control), so please use additional contraceptives (condoms or other barrier method) while you are taking the antibiotics and for an additional 5 to 7 days afterwards if you are a female on these medications.  You can alternate 600 mg of ibuprofen and [phone] mg of Tylenol every 3-6 hours as needed for pain or fever. Do not exceed 4000 mg of Tylenol daily.  Take ibuprofen with food to avoid upset stomach issues.  Take Zofran as needed for nausea.  Take Tessalon as needed for cough.  Use fluticasone nasal spray for nasal congestion.  Use phenol spray for sore throat.  You can also use warm water salt gargles, warm teas, honey.  Drink plenty of fluids and get rest.  Follow-up with PCP for reevaluation of symptoms.  Return to emergency department if any concerning signs or symptoms develop such as low oxygen saturations (less than 92%), persistent vomiting, loss of consciousness, worsening shortness of breath or chest pains.

## 2019-05-16 NOTE — ED Notes (Signed)
Patient ambulated in room, oxygen saturation ranged from 95-98% RA. No assistance needed with ambulation, NAD.

## 2019-05-17 LAB — SARS CORONAVIRUS 2 (TAT 6-24 HRS): SARS Coronavirus 2: NEGATIVE

## 2019-07-19 ENCOUNTER — Other Ambulatory Visit: Payer: Self-pay | Admitting: Family Medicine

## 2019-07-19 DIAGNOSIS — Z1231 Encounter for screening mammogram for malignant neoplasm of breast: Secondary | ICD-10-CM

## 2019-07-20 ENCOUNTER — Other Ambulatory Visit: Payer: Self-pay

## 2019-07-20 ENCOUNTER — Other Ambulatory Visit: Payer: Self-pay | Admitting: Family Medicine

## 2019-07-20 ENCOUNTER — Ambulatory Visit
Admission: RE | Admit: 2019-07-20 | Discharge: 2019-07-20 | Disposition: A | Discharge status: Home / Self Care | Payer: BC Managed Care – PPO | Source: Ambulatory Visit

## 2019-07-20 ENCOUNTER — Ambulatory Visit
Admission: RE | Admit: 2019-07-20 | Discharge: 2019-07-20 | Disposition: A | Discharge status: Home / Self Care | Payer: BC Managed Care – PPO | Source: Ambulatory Visit | Attending: Family Medicine | Admitting: Family Medicine

## 2019-07-20 DIAGNOSIS — Z1231 Encounter for screening mammogram for malignant neoplasm of breast: Secondary | ICD-10-CM

## 2019-07-20 DIAGNOSIS — R918 Other nonspecific abnormal finding of lung field: Secondary | ICD-10-CM

## 2021-05-28 ENCOUNTER — Other Ambulatory Visit: Payer: Self-pay | Admitting: Family Medicine

## 2021-05-28 DIAGNOSIS — Z1231 Encounter for screening mammogram for malignant neoplasm of breast: Secondary | ICD-10-CM

## 2021-07-21 ENCOUNTER — Ambulatory Visit
Admission: RE | Admit: 2021-07-21 | Discharge: 2021-07-21 | Disposition: A | Discharge status: Home / Self Care | Payer: BC Managed Care – PPO | Source: Ambulatory Visit | Attending: Family Medicine | Admitting: Family Medicine

## 2021-07-21 DIAGNOSIS — Z1231 Encounter for screening mammogram for malignant neoplasm of breast: Secondary | ICD-10-CM

## 2021-10-12 IMAGING — DX DG CHEST 1V PORT
1 series · 1 of 1 positions shown · non-contrast
Comparison: 01/05/2013

CLINICAL DATA: Shortness of breath, fever

EXAM:
PORTABLE CHEST 1 VIEW

[chest ap]
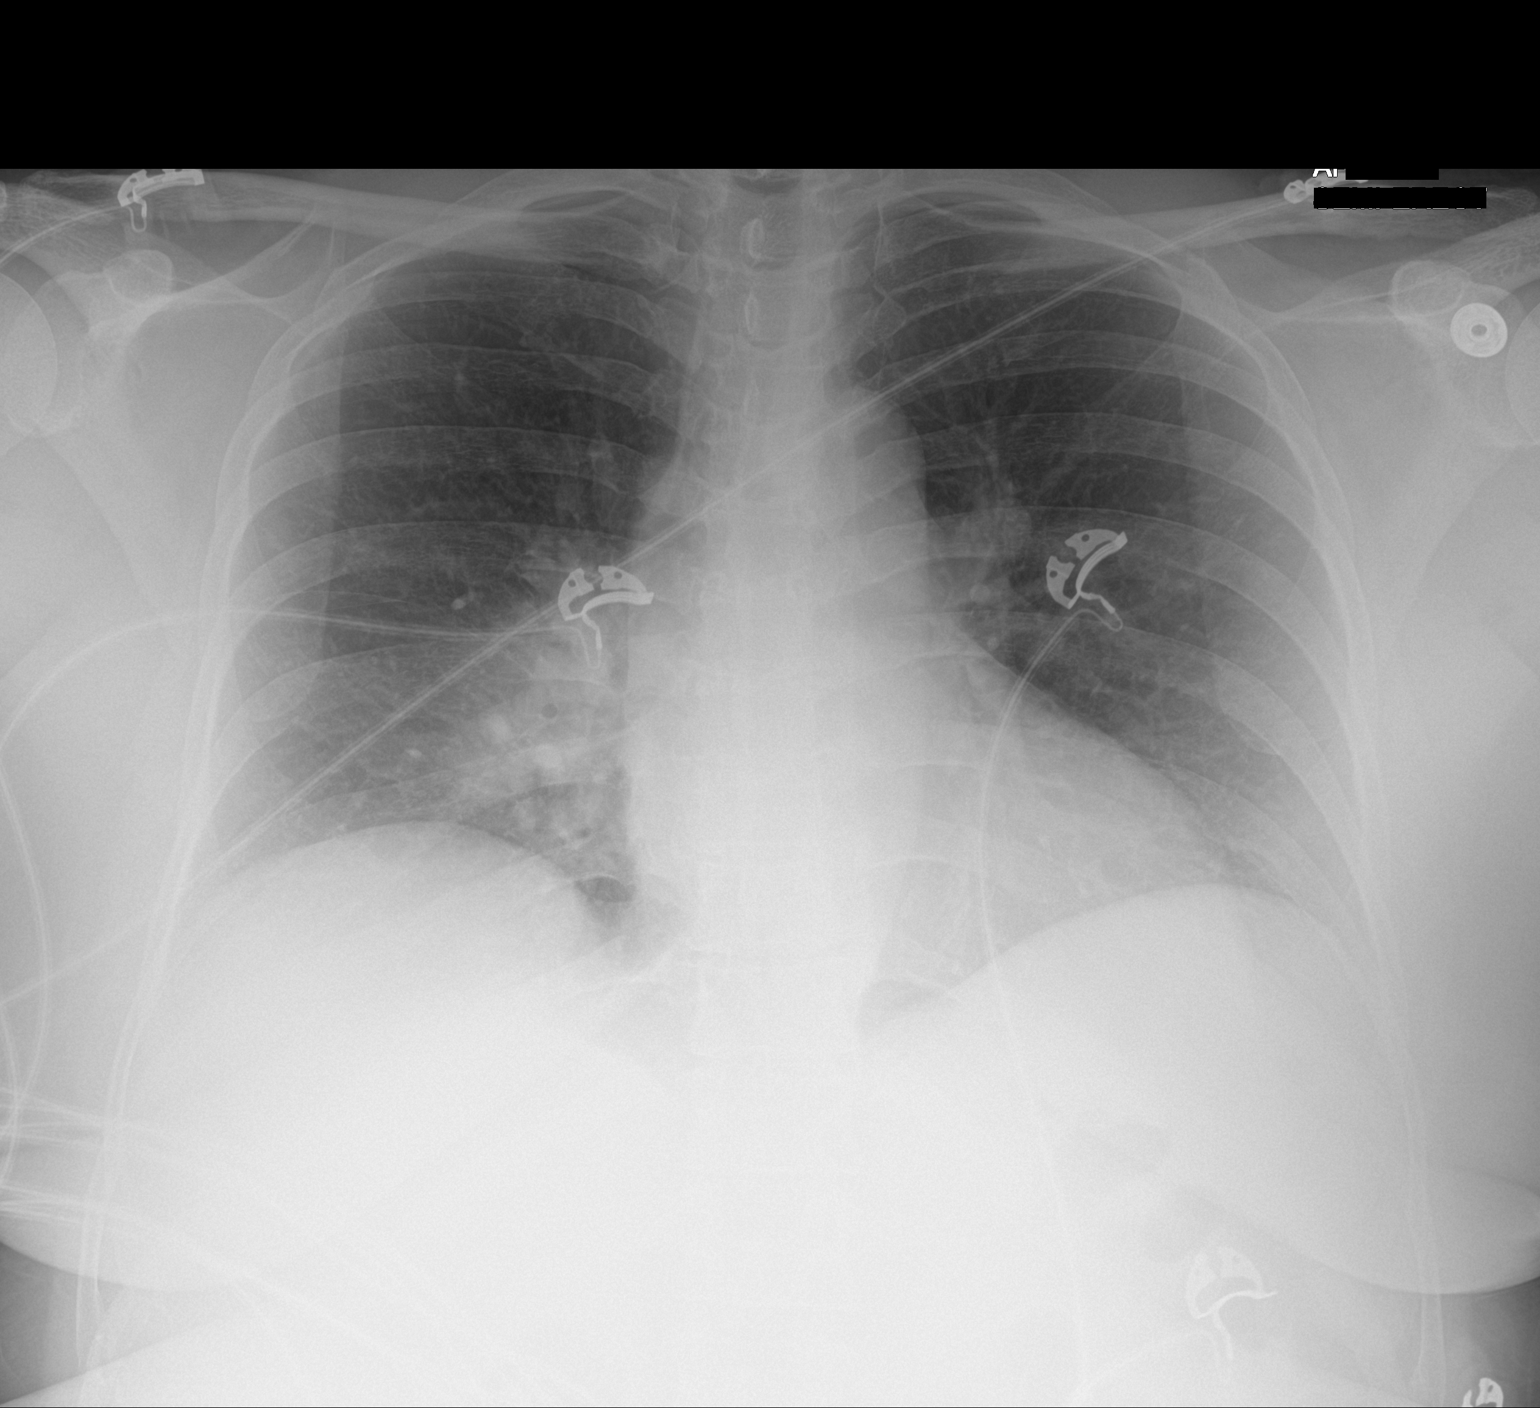

[1 of 1 positions shown; findings below may reference images not displayed]

FINDINGS: The heart size and mediastinal contours are within normal limits.
Suspect heterogeneous airspace opacity of the paramedian right
middle lobe or right lower lobe. The visualized skeletal structures
are unremarkable.
IMPRESSION: Suspect heterogeneous airspace opacity of the paramedian right
middle lobe or right lower lobe, concerning for infection. Mass is
not strictly excluded. PA and lateral radiographs may be helpful to
further assess.

## 2022-03-11 ENCOUNTER — Other Ambulatory Visit (HOSPITAL_COMMUNITY): Payer: Self-pay

## 2022-03-11 MED ORDER — ZEPBOUND 5 MG/0.5ML ~~LOC~~ SOAJ
5.0000 mg | SUBCUTANEOUS | 0 refills | Status: DC
  Filled 2022-03-11: qty 2, 28d supply, fill #0

## 2022-03-17 ENCOUNTER — Other Ambulatory Visit: Payer: Self-pay

## 2022-03-26 ENCOUNTER — Other Ambulatory Visit (HOSPITAL_COMMUNITY): Payer: Self-pay

## 2022-06-24 ENCOUNTER — Other Ambulatory Visit: Payer: Self-pay | Admitting: Family Medicine

## 2022-06-24 DIAGNOSIS — Z1231 Encounter for screening mammogram for malignant neoplasm of breast: Secondary | ICD-10-CM

## 2022-07-23 ENCOUNTER — Ambulatory Visit
Admission: RE | Admit: 2022-07-23 | Discharge: 2022-07-23 | Disposition: A | Discharge status: Home / Self Care | Payer: BC Managed Care – PPO | Source: Ambulatory Visit | Attending: Family Medicine | Admitting: Family Medicine

## 2022-07-23 DIAGNOSIS — Z1231 Encounter for screening mammogram for malignant neoplasm of breast: Secondary | ICD-10-CM

## 2022-11-08 NOTE — Progress Notes (Unsigned)
New Patient Note  RE: Caitlin Hernandez MRN: [id number] DOB: 05/01/1970 Date of Office Visit: 11/09/2022  Consult requested by: Jeri Lager, * Primary care provider: Renaye Rakers, MD  Chief Complaint: Shortness of Breath, Cough, and Wheezing  History of Present Illness: I had the pleasure of seeing Caitlin Hernandez for initial evaluation at the [facility] on 11/09/2022. She is a 52 y.o. female, who is referred here by Renaye Rakers, MD for the evaluation of asthma.  Discussed the use of AI scribe software for clinical note transcription with the patient, who gave verbal consent to proceed.  The patient was referred due to persistent respiratory issues. She reports experiencing shortness of breath, coughing, and wheezing for several years, which worsens during the summer months. The coughing intensifies with laughter but does not induce vomiting. The patient denies nocturnal symptoms but mentions the need to elevate pillows for comfortable breathing.  The patient has tried various medications, including multiple inhalers and over-the-counter allergy medications like Zyrtec and Claritin. She recalls using Singulair and a new inhaler, Breztri Aerosphere, as a sample for a week without significant improvement. She also has an Albuterol rescue inhaler, which she uses infrequently, primarily during the summer months when symptoms exacerbate.  Recently, the patient has been experiencing increased breathlessness, particularly when climbing stairs, leading to irritability and lightheadedness. These symptoms resolve after 10-15 min of rest. She denies any recent fevers, chills, or changes in appetite or bowel habits. However, she reports recent symptoms of rhinitis, including runny nose and sneezing, along with sore throat and voice changes.  The patient has a history of hypertension, - on meds. She also reports occasional heartburn but does not take any medication for  it.   The patient has seen an allergist in the past for breathing issues and allergies, with skin testing revealing allergies to horses, dogs, cats, grass, cedar, and cockroaches. She has not received allergy shots. She has a pet dog for the past three years, and she believes her symptoms have worsened since acquiring the pet.   She reports a history of heart workup, revealing a leaky valve, but no recent cardiac investigations have been conducted. She denies any history of heart attack, high cholesterol, or diabetes.   History of pneumonia: yes.  She was evaluated by allergist in the past.  Smoking exposure: denies.  Up to date with flu vaccine: yes.  Up to date with COVID-19 vaccine: first 2-3 injections. Prior Covid-19 infection: yes. History of reflux: occasionally - does not take meds.  Assessment and Plan: Caitlin Hernandez is a 52 y.o. female with: Moderate persistent asthma without complication Chronic symptoms with exacerbations, particularly in summer. Previous use of Breztri and montelukast with inconsistent adherence. Noted improvement with albuterol. Questions environmental triggers.  Today's spirometry was normal with no improvement in FEV1 post bronchodilator treatment. Clinically feeling improved.  Daily controller medication(s): start Symbicort 2 puffs twice a day with spacer and rinse mouth afterwards. Spacer given and demonstrated proper use with inhaler. Patient understood technique and all questions/concerned were addressed.  May use albuterol rescue inhaler 2 puffs every 4 to 6 hours as needed for shortness of breath, chest tightness, coughing, and wheezing. May use albuterol rescue inhaler 2 puffs 5 to 15 minutes prior to strenuous physical activities. Monitor frequency of use - if you need to use it more than twice per week on a consistent basis let us know.  Get spirometry at next visit.  Dyspnea on exertion Cardiac  work up negative in the past. Monitor symptoms. If no  improvement with above regimen recommend re-evaluation of her cardiac status. Encouraged dairy exercise.   Other allergic rhinitis Seasonal symptoms with environmental triggers. 1 dog at home. Skin testing over 10 years ago positive to horses, dogs, cats, grass, cedar, and cockroaches. No prior AIT. Return for environmental allergy skin testing. Will make additional recommendations based on results. Start Ryaltris (olopatadine + mometasone nasal spray combination) 1-2 sprays per nostril twice a day. Sample given. This replaces your other nasal sprays. If this works well for you, then have [facility] ship the medication to your home - prescription already sent in.  Nasal saline spray (i.e., Simply Saline) or nasal saline lavage (i.e., NeilMed) is recommended as needed and prior to medicated nasal sprays. Use over the counter antihistamines such as Zyrtec (cetirizine), Claritin (loratadine), Allegra (fexofenadine), or Xyzal (levocetirizine) daily as needed. May take twice a day during allergy flares. May switch antihistamines every few months.  Return in about 2 months (around 01/09/2023).  Meds ordered this encounter  Medications   budesonide-formoterol (SYMBICORT) 80-4.5 MCG/ACT inhaler    Sig: Inhale 2 puffs into the lungs in the morning and at bedtime. with spacer and rinse mouth afterwards.    Dispense:  1 each    Refill:  3   Olopatadine-Mometasone (RYALTRIS) 665-25 MCG/ACT SUSP    Sig: Place 1-2 sprays into the nose in the morning and at bedtime.    Dispense:  29 g    Refill:  5    [id number]   Lab Orders  No laboratory test(s) ordered today    Other allergy screening: Rhino conjunctivitis: yes Food allergy: no Medication allergy: no Hymenoptera allergy: no Urticaria: no Eczema:no History of recurrent infections suggestive of immunodeficency: no  Diagnostics: Spirometry:  Tracings reviewed. Her effort: Good reproducible efforts. FVC: 2.57L FEV1: 2.19L, 105%  predicted FEV1/FVC ratio: 85% Interpretation: Spirometry consistent with normal pattern with no improvement in FEV1 post bronchodilator treatment. Clinically feeling improved.   Please see scanned spirometry results for details.   Past Medical History: Patient Active Problem List   Diagnosis Date Noted   Iron deficiency anemia 07/17/2013   Menorrhagia 07/17/2013   Leukopenia 07/17/2013   Past Medical History:  Diagnosis Date   Asthma    Hypertension    Iron deficiency anemia 07/17/2013   Menorrhagia 07/17/2013   Past Surgical History: Past Surgical History:  Procedure Laterality Date   BACK SURGERY     CESAREAN SECTION     Medication List:  Current Outpatient Medications  Medication Sig Dispense Refill   budesonide-formoterol (SYMBICORT) 80-4.5 MCG/ACT inhaler Inhale 2 puffs into the lungs in the morning and at bedtime. with spacer and rinse mouth afterwards. 1 each 3   gabapentin (NEURONTIN) 300 MG capsule Take 300 mg by mouth at bedtime.     ibuprofen (ADVIL) 200 MG tablet Take 200 mg by mouth every 6 (six) hours as needed for headache or moderate pain.     Olmesartan-Amlodipine-HCTZ (TRIBENZOR) 20-5-12.5 MG TABS Take 1 tablet by mouth at bedtime.      Olopatadine-Mometasone (RYALTRIS) X543819 MCG/ACT SUSP Place 1-2 sprays into the nose in the morning and at bedtime. 29 g 5   No current facility-administered medications for this visit.   Allergies: No Known Allergies Social History: Social History   Socioeconomic History   Marital status: Married    Spouse name: Not on file   Number of children: Not on file   Years of education: Not  on file   Highest education level: Not on file  Occupational History   Not on file  Tobacco Use   Smoking status: Never    Passive exposure: Past   Smokeless tobacco: Never  Vaping Use   Vaping status: Never Used  Substance and Sexual Activity   Alcohol use: Yes    Comment: occasional   Drug use: Never   Sexual activity: Not on  file  Other Topics Concern   Not on file  Social History Narrative   Not on file   Social Determinants of Health   Financial Resource Strain: Not on file  Food Insecurity: Not on file  Transportation Needs: Not on file  Physical Activity: Not on file  Stress: Not on file  Social Connections: Unknown (05/27/2021)   Received from [facility], [facility]   Social Network    Social Network: Not on file   Lives in a house. Smoking: denies Occupation: Adult nurse HistorySurveyor, minerals in the house: yes Carpet in the family room: yes Carpet in the bedroom: yes Heating:  gas and electric Cooling: central Pet: yes 1 dog x 3 yrs  Family History: Family History  Problem Relation Age of Onset   Asthma Mother    Allergic rhinitis Father    Review of Systems  Constitutional:  Negative for appetite change, chills, fever and unexpected weight change.  HENT:  Positive for congestion, postnasal drip, rhinorrhea and sneezing.   Eyes:  Positive for itching.  Respiratory:  Positive for cough, chest tightness, shortness of breath and wheezing.   Cardiovascular:  Negative for chest pain.  Gastrointestinal:  Negative for abdominal pain.  Genitourinary:  Negative for difficulty urinating.  Skin:  Negative for rash.  Neurological:  Positive for headaches.    Objective: BP 102/70 (BP Location: Left Arm, Patient Position: Sitting, Cuff Size: Normal)   Pulse 80   Temp 98.1 F (36.7 C) (Temporal)   Resp 17   Ht 5' 0.5" (1.537 m)   Wt 197 lb 3.2 oz (89.4 kg)   LMP 07/06/2019   SpO2 95%   BMI 37.88 kg/m  Body mass index is 37.88 kg/m. Physical Exam Vitals and nursing note reviewed.  Constitutional:      Appearance: Normal appearance. She is well-developed.  HENT:     Head: Normocephalic and atraumatic.     Right Ear: Tympanic membrane and external ear normal.     Left Ear: Tympanic membrane and external ear normal.     Nose: Nose normal.     Mouth/Throat:      Mouth: Mucous membranes are moist.     Pharynx: Oropharynx is clear.  Eyes:     Conjunctiva/sclera: Conjunctivae normal.  Cardiovascular:     Rate and Rhythm: Normal rate and regular rhythm.     Heart sounds: Normal heart sounds. No murmur heard.    No friction rub. No gallop.  Pulmonary:     Effort: Pulmonary effort is normal.     Breath sounds: Normal breath sounds. No wheezing, rhonchi or rales.  Musculoskeletal:     Cervical back: Neck supple.  Skin:    General: Skin is warm.     Findings: No rash.  Neurological:     Mental Status: She is alert and oriented to person, place, and time.  Psychiatric:        Behavior: Behavior normal.   The plan was reviewed with the patient/family, and all questions/concerned were addressed.  It was my pleasure to see  Caitlin Hernandez today and participate in her care. Please feel free to contact me with any questions or concerns.  Sincerely,  Wyline Mood, DO Allergy & Immunology  [facility] office: [phone] [facility] office: [phone]

## 2022-11-09 ENCOUNTER — Encounter: Payer: Self-pay | Admitting: Allergy

## 2022-11-09 ENCOUNTER — Ambulatory Visit: Payer: BC Managed Care – PPO | Admitting: Allergy

## 2022-11-09 ENCOUNTER — Other Ambulatory Visit: Payer: Self-pay

## 2022-11-09 VITALS — BP 102/70 | HR 80 | Temp 98.1°F | Resp 17 | Ht 60.5 in | Wt 197.2 lb

## 2022-11-09 DIAGNOSIS — R0609 Other forms of dyspnea: Secondary | ICD-10-CM

## 2022-11-09 DIAGNOSIS — J4540 Moderate persistent asthma, uncomplicated: Secondary | ICD-10-CM | POA: Diagnosis not present

## 2022-11-09 DIAGNOSIS — J3089 Other allergic rhinitis: Secondary | ICD-10-CM

## 2022-11-09 MED ORDER — BUDESONIDE-FORMOTEROL FUMARATE 80-4.5 MCG/ACT IN AERO: 2.0000 | INHALATION_SPRAY | Freq: Two times a day (BID) | RESPIRATORY_TRACT | 3 refills | Status: AC

## 2022-11-09 MED ORDER — RYALTRIS 665-25 MCG/ACT NA SUSP: 1.0000 | Freq: Two times a day (BID) | NASAL | 5 refills | Status: AC

## 2022-11-09 NOTE — Patient Instructions (Signed)
Breathing  Daily controller medication(s): start Symbicort 2 puffs twice a day with spacer and rinse mouth afterwards. Spacer given and demonstrated proper use with inhaler. Patient understood technique and all questions/concerned were addressed.   May use albuterol rescue inhaler 2 puffs every 4 to 6 hours as needed for shortness of breath, chest tightness, coughing, and wheezing. May use albuterol rescue inhaler 2 puffs 5 to 15 minutes prior to strenuous physical activities. Monitor frequency of use - if you need to use it more than twice per week on a consistent basis let us know.  Breathing control goals:  Full participation in all desired activities (may need albuterol before activity) Albuterol use two times or less a week on average (not counting use with activity) Cough interfering with sleep two times or less a month Oral steroids no more than once a year No hospitalizations   Rhinitis  Return for environmental allergy skin testing. Will make additional recommendations based on results. Start Ryaltris (olopatadine + mometasone nasal spray combination) 1-2 sprays per nostril twice a day. Sample given. This replaces your other nasal sprays. If this works well for you, then have [facility] ship the medication to your home - prescription already sent in.  Nasal saline spray (i.e., Simply Saline) or nasal saline lavage (i.e., NeilMed) is recommended as needed and prior to medicated nasal sprays. Use over the counter antihistamines such as Zyrtec (cetirizine), Claritin (loratadine), Allegra (fexofenadine), or Xyzal (levocetirizine) daily as needed. May take twice a day during allergy flares. May switch antihistamines every few months.  Follow up in 2 months for follow up and skin testing visit. Make sure you don't take any antihistamines for 3 days before the skin testing appointment. Don't put any lotion on the back and arms on the day of testing.  Plan on being here for 30-60  minutes.   Pet Allergen Avoidance: Contrary to popular opinion, there are no "hypoallergenic" breeds of dogs or cats. That is because people are not allergic to an animal's hair, but to an allergen found in the animal's saliva, dander (dead skin flakes) or urine. Pet allergy symptoms typically occur within minutes. For some people, symptoms can build up and become most severe 8 to 12 hours after contact with the animal. People with severe allergies can experience reactions in public places if dander has been transported on the pet owners' clothing. Keeping an animal outdoors is only a partial solution, since homes with pets in the yard still have higher concentrations of animal allergens. Before getting a pet, ask your allergist to determine if you are allergic to animals. If your pet is already considered part of your family, try to minimize contact and keep the pet out of the bedroom and other rooms where you spend a great deal of time. As with dust mites, vacuum carpets often or replace carpet with a hardwood floor, tile or linoleum. High-efficiency particulate air (HEPA) cleaners can reduce allergen levels over time. While dander and saliva are the source of cat and dog allergens, urine is the source of allergens from rabbits, hamsters, mice and [place] pigs; so ask a non-allergic family member to clean the animal's cage. If you have a pet allergy, talk to your allergist about the potential for allergy immunotherapy (allergy shots). This strategy can often provide long-term relief.

## 2023-08-24 ENCOUNTER — Other Ambulatory Visit: Payer: Self-pay | Admitting: Family Medicine

## 2023-08-24 DIAGNOSIS — Z1231 Encounter for screening mammogram for malignant neoplasm of breast: Secondary | ICD-10-CM

## 2023-09-08 ENCOUNTER — Ambulatory Visit
Admission: RE | Admit: 2023-09-08 | Discharge: 2023-09-08 | Disposition: A | Discharge status: Home / Self Care | Payer: Self-pay | Source: Ambulatory Visit | Attending: Family Medicine | Admitting: Family Medicine

## 2023-09-08 DIAGNOSIS — Z1231 Encounter for screening mammogram for malignant neoplasm of breast: Secondary | ICD-10-CM
